# Patient Record
Sex: Male | Born: 1995 | Race: White | Hispanic: No | Marital: Single | Smoking: Current every day smoker
Health system: Southern US, Community
[De-identification: ages and names within clinical notes are randomized; demographics above are authoritative.]

## PROBLEM LIST (undated history)

## (undated) DIAGNOSIS — F909 Attention-deficit hyperactivity disorder, unspecified type: Secondary | ICD-10-CM

## (undated) DIAGNOSIS — R6250 Unspecified lack of expected normal physiological development in childhood: Secondary | ICD-10-CM

## (undated) DIAGNOSIS — E049 Nontoxic goiter, unspecified: Secondary | ICD-10-CM

## (undated) DIAGNOSIS — F633 Trichotillomania: Secondary | ICD-10-CM

## (undated) DIAGNOSIS — IMO0002 Reserved for concepts with insufficient information to code with codable children: Secondary | ICD-10-CM

## (undated) DIAGNOSIS — E063 Autoimmune thyroiditis: Secondary | ICD-10-CM

## (undated) DIAGNOSIS — R4689 Other symptoms and signs involving appearance and behavior: Secondary | ICD-10-CM

## (undated) DIAGNOSIS — R630 Anorexia: Secondary | ICD-10-CM

## (undated) DIAGNOSIS — E079 Disorder of thyroid, unspecified: Secondary | ICD-10-CM

## (undated) HISTORY — DX: Other symptoms and signs involving appearance and behavior: R46.89

## (undated) HISTORY — DX: Autoimmune thyroiditis: E06.3

## (undated) HISTORY — PX: OTHER SURGICAL HISTORY: SHX169

## (undated) HISTORY — DX: Reserved for concepts with insufficient information to code with codable children: IMO0002

## (undated) HISTORY — DX: Anorexia: R63.0

## (undated) HISTORY — DX: Nontoxic goiter, unspecified: E04.9

## (undated) HISTORY — DX: Attention-deficit hyperactivity disorder, unspecified type: F90.9

## (undated) HISTORY — DX: Unspecified lack of expected normal physiological development in childhood: R62.50

## (undated) HISTORY — DX: Trichotillomania: F63.3

---
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
## 2014-01-07 ENCOUNTER — Ambulatory Visit: Payer: Medicaid Other | Admitting: "Endocrinology

## 2014-10-09 ENCOUNTER — Telehealth: Payer: Self-pay | Admitting: "Endocrinology

## 2014-10-09 NOTE — Telephone Encounter (Signed)
1. Mother called to request that I call [facility] to refill his prescriptions. 2. I called the mother. He has not been seen in our clinic since July 2014. According to the laws of [place] we are forbidden to write any prescriptions for patients whom we have not seen within 12 months. I told mom that I will be glad to see Blake Pope in follow up if he is willing to come in for future appointments, but I can't help her with prescriptions until I see him again.  3. Mom stated that she understood. She will make an appointment for him with a local provider on Monday. David StallBRENNAN,MICHAEL J

## 2014-10-10 ENCOUNTER — Emergency Department (INDEPENDENT_AMBULATORY_CARE_PROVIDER_SITE_OTHER)
Admission: EM | Admit: 2014-10-10 | Discharge: 2014-10-10 | Disposition: A | Discharge status: Home / Self Care | Payer: Medicaid Other | Source: Home / Self Care | Attending: Family Medicine | Admitting: Family Medicine

## 2014-10-10 ENCOUNTER — Encounter (HOSPITAL_COMMUNITY): Payer: Self-pay | Admitting: Emergency Medicine

## 2014-10-10 DIAGNOSIS — R590 Localized enlarged lymph nodes: Secondary | ICD-10-CM

## 2014-10-10 LAB — POCT RAPID STREP A: Streptococcus, Group A Screen (Direct): NEGATIVE

## 2014-10-10 LAB — POCT INFECTIOUS MONO SCREEN: MONO SCREEN: NEGATIVE

## 2014-10-10 MED ORDER — AMOXICILLIN-POT CLAVULANATE 500-125 MG PO TABS: 1.0000 | ORAL_TABLET | Freq: Three times a day (TID) | ORAL | Status: DC

## 2014-10-10 NOTE — Discharge Instructions (Signed)
Swollen Lymph Nodes The lymphatic system filters fluid from around cells. It is like a system of blood vessels. These channels carry lymph instead of blood. The lymphatic system is an important part of the immune (disease fighting) system. When people talk about "swollen glands in the neck," they are usually talking about swollen lymph nodes. The lymph nodes are like the little traps for infection. You and your caregiver may be able to feel lymph nodes, especially swollen nodes, in these common areas: the groin (inguinal area), armpits (axilla), and above the clavicle (supraclavicular). You may also feel them in the neck (cervical) and the back of the head just above the hairline (occipital). Swollen glands occur when there is any condition in which the body responds with an allergic type of reaction. For instance, the glands in the neck can become swollen from insect bites or any type of minor infection on the head. These are very noticeable in children with only minor problems. Lymph nodes may also become swollen when there is a tumor or problem with the lymphatic system, such as Hodgkin's disease. TREATMENT   Most swollen glands do not require treatment. They can be observed (watched) for a short period of time, if your caregiver feels it is necessary. Most of the time, observation is not necessary.  Antibiotics (medicines that kill germs) may be prescribed by your caregiver. Your caregiver may prescribe these if he or she feels the swollen glands are due to a bacterial (germ) infection. Antibiotics are not used if the swollen glands are caused by a virus. HOME CARE INSTRUCTIONS   Take medications as directed by your caregiver. Only take over-the-counter or prescription medicines for pain, discomfort, or fever as directed by your caregiver. SEEK MEDICAL CARE IF:   If you begin to run a temperature greater than 102 F (38.9 C), or as your caregiver suggests. MAKE SURE YOU:   Understand these  instructions.  Will watch your condition.  Will get help right away if you are not doing well or get worse. Document Released: 10/06/2002 Document Revised: 01/08/2012 Document Reviewed: 10/16/2005 [facility]Exit[facility] Patient Information 2015 [place], [place]. This information is not intended to replace advice given to you by your health care provider. Make sure you discuss any questions you have with your health care provider.  Cervical Adenitis You have a swollen lymph gland in your neck. This commonly happens with Strep and virus infections, dental problems, insect bites, and injuries about the face, scalp, or neck. The lymph glands swell as the body fights the infection or heals the injury. Swelling and firmness typically lasts for several weeks after the infection or injury is healed. Rarely lymph glands can become swollen because of cancer or TB. Antibiotics are prescribed if there is evidence of an infection. Sometimes an infected lymph gland becomes filled with pus. This condition may require opening up the abscessed gland by draining it surgically. Most of the time infected glands return to normal within two weeks. Do not poke or squeeze the swollen lymph nodes. That may keep them from shrinking back to their normal size. If the lymph gland is still swollen after 2 weeks, further medical evaluation is needed.  SEEK IMMEDIATE MEDICAL CARE IF:  You have difficulty swallowing or breathing, increased swelling, severe pain, or a high fever.  Document Released: 10/16/2005 Document Revised: 01/08/2012 Document Reviewed: 04/07/2007 [facility] Patient Information 2015 [place], [place]. This information is not intended to replace advice given to you by your health care provider. Make sure you  discuss any questions you have with your health care provider. ° °

## 2014-10-10 NOTE — ED Provider Notes (Signed)
CSN: [id number]     Arrival date & time 10/10/14  1315 History   First MD Initiated Contact with Patient 10/10/14 1340     Chief Complaint  Patient presents with  . Mass   (Consider location/radiation/quality/duration/timing/severity/associated sxs/prior Treatment) HPI Comments: 18 year old male concerned about a small tender nodule in the submental area. There is been no discoloration, drainage or bleeding. Has not changed in size.   Past Medical History  Diagnosis Date  . Physical growth delay   . Goiter   . Hypothyroidism, acquired, autoimmune   . Thyroiditis, autoimmune   . Poor appetite   . Trichotillomania   . ADHD (attention deficit hyperactivity disorder)   . Behavioral problems    Past Surgical History  Procedure Laterality Date  . Lacrimal duct surgery     Family History  Problem Relation Age of Onset  . Cancer Mother   . Hypertension Mother   . Diabetes Paternal Grandmother    History  Substance Use Topics  . Smoking status: Passive Smoke Exposure - Never Smoker  . Smokeless tobacco: Never Used  . Alcohol Use: No    Review of Systems  Constitutional: Negative.   HENT: Negative.  Negative for congestion, ear pain, postnasal drip and sore throat.   Respiratory: Negative.   Gastrointestinal: Negative.   Musculoskeletal: Negative for back pain, neck pain and neck stiffness.  Skin: Negative for rash.  All other systems reviewed and are negative.   Allergies  Review of patient's allergies indicates no known allergies.  Home Medications   Prior to Admission medications   Medication Sig Start Date End Date Taking? Authorizing Provider  amoxicillin-clavulanate (AUGMENTIN) 500-125 MG per tablet Take 1 tablet (500 mg total) by mouth 3 (three) times daily. 10/10/14   Hayden Rasmussenavid Rayona Sardinha, NP  cyproheptadine (PERIACTIN) 4 MG tablet TAKE 1 TABLET BY MOUTH AT BREAKFAST AND 1 TABLET AT SUPPER 01/12/12   Shandrika Ambers StallMichael J Brennan, MD  fluvoxaMINE (LUVOX) 50 MG tablet Take 50 mg by  mouth 2 (two) times daily.      Historical Provider, MD  GuanFACINE HCl (INTUNIV) 4 MG TB24 Take by mouth.      Historical Provider, MD  lisdexamfetamine (VYVANSE) 40 MG capsule Take 40 mg by mouth every morning.      Historical Provider, MD  mirtazapine (REMERON) 7.5 MG tablet Take 7.5 mg by mouth at bedtime.     Historical Provider, MD  SYNTHROID 50 MCG tablet Take 1 tablet (50 mcg total) by mouth daily before breakfast. Take one tablet on odd days and 1.5 tablets on even days. 05/13/13 05/13/14  Lucresia Simic StallMichael J Brennan, MD   BP 123/76 mmHg  Pulse 91  Temp(Src) 97.9 F (36.6 C) (Oral)  Resp 16  SpO2 100% Physical Exam  Constitutional: He is oriented to person, place, and time. He appears well-developed and well-nourished. No distress.  Does not appear ill.  HENT:  Mouth/Throat: No oropharyngeal exudate.  Bilateral TMs are normal Oropharynx with moderate erythema. No exudates.  Eyes: Conjunctivae and EOM are normal.  Neck: Normal range of motion. Neck supple.  Pulmonary/Chest: Effort normal. No respiratory distress.  Lymphadenopathy:       Head (right side): Submental and submandibular adenopathy present. No preauricular, no posterior auricular and no occipital adenopathy present.       Head (left side): Submandibular adenopathy present.    He has cervical adenopathy.       Right cervical: Superficial cervical adenopathy present. No posterior cervical adenopathy present.  Left cervical: Superficial cervical adenopathy present. No posterior cervical adenopathy present.  Neurological: He is alert and oriented to person, place, and time.  Skin: Skin is warm and dry. No rash noted.  Psychiatric: He has a normal mood and affect.  Nursing note and vitals reviewed.   ED Course  Procedures (including critical care time) Labs Review Labs Reviewed  POCT RAPID STREP A ([facility] ONLY)   Results for orders placed or performed during the hospital encounter of 10/10/14  POCT rapid strep A  [facility])  Result Value Ref Range   Streptococcus, Group A Screen (Direct) NEGATIVE NEGATIVE    Imaging Review No results found.   MDM   1. Lymphadenopathy, cervical    Augmentin x 7 d Call your doctor Monday for appt May need additional blood work as well as thyroid testing and meds Mono spot pending       Hayden RasmussenDavid Tishana Clinkenbeard, NP 10/10/14 1448

## 2014-10-10 NOTE — ED Notes (Signed)
C/o mass under chin/throat onset 1 week Painful, 5/10,  when pressure is applied; constant mild pain 1/10 Denies inj/trauma, fevers, chills Also reports he has been out of his Thyroid meds x8 months ++ Alert, no signs of acute distress.

## 2014-10-12 LAB — CULTURE, GROUP A STREP

## 2014-11-10 ENCOUNTER — Encounter: Payer: Self-pay | Admitting: "Endocrinology

## 2014-11-10 ENCOUNTER — Ambulatory Visit (INDEPENDENT_AMBULATORY_CARE_PROVIDER_SITE_OTHER): Payer: Medicaid Other | Admitting: "Endocrinology

## 2014-11-10 VITALS — BP 132/80 | HR 112 | Wt 120.2 lb

## 2014-11-10 DIAGNOSIS — E063 Autoimmune thyroiditis: Secondary | ICD-10-CM

## 2014-11-10 DIAGNOSIS — G44229 Chronic tension-type headache, not intractable: Secondary | ICD-10-CM

## 2014-11-10 DIAGNOSIS — E049 Nontoxic goiter, unspecified: Secondary | ICD-10-CM

## 2014-11-10 DIAGNOSIS — E038 Other specified hypothyroidism: Secondary | ICD-10-CM

## 2014-11-10 MED ORDER — SYNTHROID 75 MCG PO TABS: 75.0000 ug | ORAL_TABLET | Freq: Every day | ORAL | Status: DC

## 2014-11-10 NOTE — Patient Instructions (Signed)
Follow up visit in 3 months. Please have your thyroid blood tests drawn one week prior to your next visit.

## 2014-11-10 NOTE — Progress Notes (Addendum)
Subjective:  Patient Name: Blake Pope Date of Birth: Dec 02, 1995  MRN: [id number]  Blake Pope  presents to the office today for follow-up of his hypothyroidism, thyroiditis, growth delay, goiter, and poor appetite.  HISTORY OF PRESENT ILLNESS:   Blake Pope is a 19 y.o. Caucasian young man. Blake Pope was accompanied by his girlfriend, Blake Pope.  1. I first saw the patient in consultation on 09/06/06. He been referred by Dr. Yehuda Budd for evaluation and management of growth delay. He was then 52 years old.  A. The child was the product of an uneventful pregnancy, except for the fact that the mother smoked. The baby was born at term. Birth weight was 6 lbs. 7 oz. He had a closed lacrimal duct that was opened surgically at 64 months of age. Around the age of 2 or 3 he was diagnosed with ADHD. Around the age of 3 or 4 he started on stimulant medications. Mother had been concerned for some years about the fact that the child was not growing well. She was also concerned about the fact that he was not eating well at home. He was quite active. Family history was positive for a variety of heights. The father's height was 73 inches. The mother's height was 60-1/4 inches. A maternal grand-cousin was only 4 foot tall.  B. On physical examination, Blake Pope was a very short, slender little boy. His height was slightly less than the 3rd percentile. His weight was significantly less than 3rd percentile. He was the size of an average 80-year-old. His physical examination was otherwise unremarkable. His IGF-1 was 134 which was normal for the patient's age. IGFBP-3 was 4.2, which was mid-range normal. TFTs were abnormal, with TSH elevated at 7.839, free T4 low-normal at 0.96, and free T3 relatively low for his age at 3.0. At that point it appeared that the patient likely had growth delay due to several factors that were independently affecting his growth. The patient had a pattern of relative protein-calorie malnutrition. The  stimulant medications definitely suppressed his appetite.  He was a very physically active young man, consistent with his diagnosis of ADD. There was a wide variety of family heights including some that were very short. And finally, the patient was clearly hypothyroid at that time. The question was whether his hypothyroidism was permanent or transient.  C. I repeated the TFTs the following month. His TSH was 6.222, free T4 was 1.20, and free T3 was 3.4. At that point I started him on Synthroid, 25 mcg per day. Within 2 months the TSH was down to 1.998, free T4 was 1.41, and free T3 was 3.7. During the next 6 years we have gradually increased his dose of Synthroid to 50 mcg per day alternating with 75 mcg/day. We also started him on cyproheptadine 4 mg twice daily as an appetite stimulant. After the cyproheptadine was initiated in September 2010, his weight began to increase significantly and his height followed, but to a lesser extent. He continued to grow in height on the 3rd percentile curve. His weight had increased to the 15-18% range.  2. The patient's last [facility] visit was on 05/28/13. In the interim, he has been healthy except for an occasional URI. He has been having headaches about 1-2 times a per month. Headaches typically occur in the posterior part of the scalp. His trichotillomania has resolved for the most part. He is stressed out now about trying to find a job. The patient is back on probation. He did not want to talk  about what misdemeanor put him back in probation. He has about 18 months of probation remaining. He has not had any Synthroid or other medications for several months. He now has a good appetite. He is doing pretty well emotionally, despite losing his dad unexpectedly to an esophageal hemorrhage  in October.   3. Pertinent Review of Systems:  Constitutional: The patient feels "good, but lazy every once in awhile".  Eyes: Vision seems to be good. There are no recognized eye  problems. Neck: The patient has no complaints of anterior neck swelling, soreness, tenderness, pressure, discomfort, or difficulty swallowing.   Heart: Heart rate increases with exercise or other physical activity. The patient has no complaints of palpitations, irregular heart beats, chest pain, or chest pressure.   Gastrointestinal: Bowel movents seem normal. The patient has no complaints of excessive hunger, acid reflux, upset stomach, stomach aches or pains, diarrhea, or constipation.  Legs: He has occasional knee pains if he does a lot of physical activity. Muscle mass and strength seem normal. There are no other complaints of numbness, tingling, burning, or pain. No edema is noted.  Feet: There are no obvious foot problems. There are no complaints of numbness, tingling, burning, or pain. No edema is noted. Neurologic: There are no recognized problems with muscle movement and strength, sensation, or coordination.  PAST MEDICAL AND FAMILY HISTORY  Past Medical History  Diagnosis Date  . Physical growth delay   . Goiter   . Hypothyroidism, acquired, autoimmune   . Thyroiditis, autoimmune   . Poor appetite   . Trichotillomania   . ADHD (attention deficit hyperactivity disorder)   . Behavioral problems     Family History  Problem Relation Age of Onset  . Cancer Mother   . Hypertension Mother   . Diabetes Paternal Grandmother     Current outpatient prescriptions: amoxicillin-clavulanate (AUGMENTIN) 500-125 MG per tablet, Take 1 tablet (500 mg total) by mouth 3 (three) times daily. (Patient not taking: Reported on 11/10/2014), Disp: 21 tablet, Rfl: 0;  cyproheptadine (PERIACTIN) 4 MG tablet, TAKE 1 TABLET BY MOUTH AT BREAKFAST AND 1 TABLET AT SUPPER (Patient not taking: Reported on 11/10/2014), Disp: 60 tablet, Rfl: 3 fluvoxaMINE (LUVOX) 50 MG tablet, Take 50 mg by mouth 2 (two) times daily.  , Disp: , Rfl: ;  GuanFACINE HCl (INTUNIV) 4 MG TB24, Take by mouth.  , Disp: , Rfl: ;   lisdexamfetamine (VYVANSE) 40 MG capsule, Take 40 mg by mouth every morning.  , Disp: , Rfl: ;  mirtazapine (REMERON) 7.5 MG tablet, Take 7.5 mg by mouth at bedtime. , Disp: , Rfl:  SYNTHROID 50 MCG tablet, Take 1 tablet (50 mcg total) by mouth daily before breakfast. Take one tablet on odd days and 1.5 tablets on even days., Disp: 45 tablet, Rfl: 6;  [DISCONTINUED] cetirizine (ZYRTEC) 10 MG tablet, Take 1 tablet (10 mg total) by mouth daily., Disp: 30 tablet, Rfl: 0  Allergies as of 11/10/2014  . (No Known Allergies)    SOCIAL HISTORY  1. School: He is still living with his mom until he finds a steady job.  2. Activities: He has been fairly active. 3. Smoking, alcohol, or drugs: He is smoking now. He is not drinking alcohol. He is not taking any illicit drugs.  4. Primary Care Provider: None now. He has seen Dr. Billee CashingWayland McKenzie in the past.  REVIEW OF SYSTEMS: There are no other significant problems involving Blake Pope's other body systems.   Objective:  Vital Signs:  BP  132/80 mmHg  Pulse 112  Wt 120 lb 3.2 oz (54.522 kg)   Ht Readings from Last 3 Encounters:  05/13/13 5' 3.86" (1.622 m) (3 %*, Z = -1.94)  10/28/12 5' 3.5" (1.613 m) (2 %*, Z = -2.00)  08/29/11 5' 3.23" (1.606 m) (3 %*, Z = -1.84)   * Growth percentiles are based on CDC 2-20 Years data.   There is no height on file to calculate BSA.  PHYSICAL EXAM:  Constitutional: The patient appears healthy, slender, but muscular. The patient's height has plateaued. His weight has increased 9 pounds since his lest visit. He is a bright, alert, and much more mature today.   Head: The head is normocephalic. Face: The face appears normal. There are no obvious dysmorphic features. Eyes: There is no obvious arcus or proptosis. Moisture appears normal. Mouth: The oropharynx and tongue appear normal. Dentition appears to be normal for age. Oral moisture is normal. Neck: The neck appears to be visibly normal. No carotid bruits are  noted. The thyroid gland is a bit larger at about 21-22 grams in size. The consistency of the thyroid gland is relatively firm. The thyroid gland is not tender to palpation. His trapezius muscles are somewhat stiff and mildly tender today. Lungs: The lungs are clear to auscultation. Air movement is good. Heart: Heart rate and rhythm are regular. Heart sounds S1 and S2 are normal. I did not appreciate any pathologic cardiac murmurs. His physique is slender, but not emaciated.  Abdomen: The abdomen is normal in size for the patient's age. Bowel sounds are normal. There is no obvious hepatomegaly, splenomegaly, or other mass effect.  Arms: Muscle size and bulk are normal for age. Hands: There is no obvious tremor. Phalangeal and metacarpophalangeal joints are normal. Palmar muscles are normal for age. Palmar skin is normal. Palmar moisture is also normal. Legs: Muscles appear normal for age. No edema is present. Neurologic: Strength is normal for age in both the upper and lower extremities. Muscle tone is normal. Sensation to touch is normal in both legs.    LAB DATA:   05/12/13: TSH 5.492, free T4 1.43, free T3 4.3  10/25/12: TSH 2.95, free T4 1.32, free T3 3.5  06/30/11: TSH 2.898, free T4 1.35, free T3 3.7.  Both lab results were on Synthroid 50 mcg per day.  No results found for this or any previous visit (from the past 504 hour(s)).   Assessment and Plan:   ASSESSMENT:  1. Hypothyroid: Patient has been without Synthroid for several months. He needs to re-start Synthroid at a slightly higher dose of 75 mcg/day and then repeat his TFTs in 2 months.   2.  Hashimoto's disease: Inflammation is clinically quiescent.  3.  Goiter: The thyroid is larger today, partly due to thyroiditis and partly due to increased TSH.  4. Headaches: The character of his headaches and the neurologic distribution of his headaches is most c/w tension headaches. The presumed hypothyroidism is likely causing some  additional muscle stiffness and pain.    PLAN:  1. Diagnostic:  TFTs in three months  2. Therapeutic: Increase Synthroid to 75 mcg daily.  3. Patient Education: We discussed all of the adverse effects of chronic hypothyroidism.  4. Follow-up: 3 months  Level of Service: This visit lasted in excess of 40 minutes. More than 50% of the visit was devoted to counseling.  David Stall, MD 11/10/2014 1:50 PM

## 2015-01-28 ENCOUNTER — Other Ambulatory Visit: Payer: Self-pay | Admitting: *Deleted

## 2015-01-28 DIAGNOSIS — E034 Atrophy of thyroid (acquired): Secondary | ICD-10-CM

## 2015-02-15 ENCOUNTER — Ambulatory Visit: Payer: Medicaid Other | Admitting: "Endocrinology

## 2015-06-18 DIAGNOSIS — Z8659 Personal history of other mental and behavioral disorders: Secondary | ICD-10-CM | POA: Diagnosis not present

## 2015-06-18 DIAGNOSIS — J340 Abscess, furuncle and carbuncle of nose: Secondary | ICD-10-CM | POA: Diagnosis present

## 2015-06-18 DIAGNOSIS — L03211 Cellulitis of face: Secondary | ICD-10-CM | POA: Insufficient documentation

## 2015-06-18 DIAGNOSIS — E049 Nontoxic goiter, unspecified: Secondary | ICD-10-CM | POA: Diagnosis not present

## 2015-06-18 DIAGNOSIS — E063 Autoimmune thyroiditis: Secondary | ICD-10-CM | POA: Diagnosis not present

## 2015-06-19 ENCOUNTER — Emergency Department (HOSPITAL_COMMUNITY)
Admission: EM | Admit: 2015-06-19 | Discharge: 2015-06-19 | Disposition: A | Discharge status: Home / Self Care | Payer: Medicaid Other | Attending: Emergency Medicine | Admitting: Emergency Medicine

## 2015-06-19 ENCOUNTER — Encounter (HOSPITAL_COMMUNITY): Payer: Self-pay | Admitting: Emergency Medicine

## 2015-06-19 ENCOUNTER — Emergency Department (HOSPITAL_COMMUNITY): Payer: Medicaid Other

## 2015-06-19 DIAGNOSIS — L03211 Cellulitis of face: Secondary | ICD-10-CM

## 2015-06-19 LAB — CBC WITH DIFFERENTIAL/PLATELET
BASOS ABS: 0.1 10*3/uL (ref 0.0–0.1)
Basophils Relative: 1 % (ref 0–1)
Eosinophils Absolute: 0.3 10*3/uL (ref 0.0–0.7)
Eosinophils Relative: 3 % (ref 0–5)
HEMATOCRIT: 41.2 % (ref 39.0–52.0)
Hemoglobin: 14.4 g/dL (ref 13.0–17.0)
LYMPHS PCT: 30 % (ref 12–46)
Lymphs Abs: 2.8 10*3/uL (ref 0.7–4.0)
MCH: 29.8 pg (ref 26.0–34.0)
MCHC: 35 g/dL (ref 30.0–36.0)
MCV: 85.3 fL (ref 78.0–100.0)
Monocytes Absolute: 0.8 10*3/uL (ref 0.1–1.0)
Monocytes Relative: 8 % (ref 3–12)
NEUTROS ABS: 5.3 10*3/uL (ref 1.7–7.7)
Neutrophils Relative %: 58 % (ref 43–77)
Platelets: 215 10*3/uL (ref 150–400)
RBC: 4.83 MIL/uL (ref 4.22–5.81)
RDW: 12.8 % (ref 11.5–15.5)
WBC: 9.2 10*3/uL (ref 4.0–10.5)

## 2015-06-19 LAB — I-STAT CHEM 8, ED
BUN: 6 mg/dL (ref 6–20)
CREATININE: 0.8 mg/dL (ref 0.61–1.24)
Calcium, Ion: 1.18 mmol/L (ref 1.12–1.23)
Chloride: 100 mmol/L — ABNORMAL LOW (ref 101–111)
GLUCOSE: 69 mg/dL (ref 65–99)
HCT: 44 % (ref 39.0–52.0)
Hemoglobin: 15 g/dL (ref 13.0–17.0)
POTASSIUM: 3.7 mmol/L (ref 3.5–5.1)
Sodium: 140 mmol/L (ref 135–145)
TCO2: 27 mmol/L (ref 0–100)

## 2015-06-19 MED ORDER — SODIUM CHLORIDE 0.9 % IV SOLN
1.5000 g | Freq: Once | INTRAVENOUS | Status: AC
  Administered 2015-06-19: 1.5 g via INTRAVENOUS
  Filled 2015-06-19: qty 1.5

## 2015-06-19 MED ORDER — KETOROLAC TROMETHAMINE 30 MG/ML IJ SOLN: 30.0000 mg | Freq: Once | INTRAMUSCULAR | Status: DC

## 2015-06-19 MED ORDER — CLINDAMYCIN HCL 150 MG PO CAPS: 300.0000 mg | ORAL_CAPSULE | Freq: Four times a day (QID) | ORAL | Status: DC

## 2015-06-19 MED ORDER — MORPHINE SULFATE (PF) 4 MG/ML IV SOLN: 4.0000 mg | Freq: Once | INTRAVENOUS | Status: DC

## 2015-06-19 MED ORDER — IOHEXOL 300 MG/ML  SOLN
75.0000 mL | Freq: Once | INTRAMUSCULAR | Status: AC | PRN
  Administered 2015-06-19: 75 mL via INTRAVENOUS

## 2015-06-19 MED ORDER — TRAMADOL HCL 50 MG PO TABS: 50.0000 mg | ORAL_TABLET | Freq: Four times a day (QID) | ORAL | Status: DC | PRN

## 2015-06-19 NOTE — Discharge Instructions (Signed)

## 2015-06-19 NOTE — ED Provider Notes (Signed)
CSN: [id number]     Arrival date & time 06/18/15  2357 History  This chart was scribed for Blake Severin, MD by Tanda Rockers, ED Scribe. This patient was seen in room A08C/A08C and the patient's care was started at 1:13 AM.  Chief Complaint  Patient presents with  . Abscess   The history is provided by the patient. No language interpreter was used.     HPI Comments: Blake Pope is a 19 y.o. male who presents to the Emergency Department complaining of abscess to right side of nose that began 1 day ago. Pt states it started out as a pimple. He attempted to pop it at approximately 2-3 PM yesterday (approximately 11 hours ago). He states that he noticed swelling to the area as well as underneath his right eye around 6 PM today (7 hours ago), causing concern. Pt states he attempted to pop it again and states pus came out. Pt states he is having difficulty seeing out of his right eye due to the swelling. He also has mild pain with blinking his right eye. He denies fever, chills, blurred vision, or any other associated symptoms.   Past Medical History  Diagnosis Date  . Physical growth delay   . Goiter   . Hypothyroidism, acquired, autoimmune   . Thyroiditis, autoimmune   . Poor appetite   . Trichotillomania   . ADHD (attention deficit hyperactivity disorder)   . Behavioral problems    Past Surgical History  Procedure Laterality Date  . Lacrimal duct surgery     Family History  Problem Relation Age of Onset  . Cancer Mother   . Hypertension Mother   . Diabetes Paternal Grandmother    Social History  Substance Use Topics  . Smoking status: Passive Smoke Exposure - Never Smoker  . Smokeless tobacco: Never Used  . Alcohol Use: No    Review of Systems  Constitutional: Negative for fever and chills.  Eyes: Positive for pain. Negative for visual disturbance.  Skin:       Abscess to right side of nose  All other systems reviewed and are negative.  Allergies  Review of patient's  allergies indicates no known allergies.  Home Medications   Prior to Admission medications   Medication Sig Start Date End Date Taking? Authorizing Provider  ibuprofen (ADVIL,MOTRIN) 200 MG tablet Take 400 mg by mouth every 6 (six) hours as needed for moderate pain.   Yes Historical Provider, MD  SYNTHROID 75 MCG tablet Take 1 tablet (75 mcg total) by mouth daily before breakfast. Take one 75 mcg tablet of brand Synthroid daily. 11/10/14 11/11/15 Yes David Stall, MD   Triage Vitals: BP 121/70 mmHg  Pulse 94  Temp(Src) 98.1 F (36.7 C) (Oral)  Resp 18  Ht 5\' 5"  (1.651 m)  Wt 120 lb (54.432 kg)  BMI 19.97 kg/m2  SpO2 99%   Physical Exam  Constitutional: He is oriented to person, place, and time. He appears well-developed and well-nourished.  HENT:  Head: Normocephalic and atraumatic.  Nose: Nose normal.  Mouth/Throat: Oropharynx is clear and moist.  Patient has soft tissue swelling starting below the right eye and extending to the right upper lip.  There is a pustule just lateral to the nose which patient reports he has popped twice today expressing pus.  There is no fluctuance with palpation at this time.  No purulence.  There is erythema  Eyes: Conjunctivae and EOM are normal. Pupils are equal, round, and reactive to light.  Neck: Normal range of motion. Neck supple. No JVD present. No tracheal deviation present. No thyromegaly present.  Cardiovascular: Normal rate, regular rhythm, normal heart sounds and intact distal pulses.  Exam reveals no gallop and no friction rub.   No murmur heard. Pulmonary/Chest: Effort normal and breath sounds normal. No stridor. No respiratory distress. He has no wheezes. He has no rales. He exhibits no tenderness.  Abdominal: Soft. Bowel sounds are normal. He exhibits no distension and no mass. There is no tenderness. There is no rebound and no guarding.  Musculoskeletal: Normal range of motion. He exhibits no edema or tenderness.  Lymphadenopathy:     He has no cervical adenopathy.  Neurological: He is alert and oriented to person, place, and time. He displays normal reflexes. He exhibits normal muscle tone. Coordination normal.  Skin: Skin is warm and dry. No rash noted. No erythema. No pallor.  Psychiatric: He has a normal mood and affect. His behavior is normal. Judgment and thought content normal.  Nursing note and vitals reviewed.   ED Course  Procedures (including critical care time)  DIAGNOSTIC STUDIES: Oxygen Saturation is 99% on RA, normal by my interpretation.    COORDINATION OF CARE: 1:17 AM-Discussed treatment plan which includes IV antibiotics, CT maxillofacial with pt at bedside and pt agreed to plan.   Labs Review Labs Reviewed  I-STAT CHEM 8, ED - Abnormal; Notable for the following:    Chloride 100 (*)    All other components within normal limits  CBC WITH DIFFERENTIAL/PLATELET    Imaging Review Ct Maxillofacial W/cm  06/19/2015   CLINICAL DATA:  Swelling, pain and pus on RIGHT side of nose, ascending toward the eye. History of hypothyroidism.  EXAM: CT MAXILLOFACIAL WITH CONTRAST  TECHNIQUE: Multidetector CT imaging of the maxillofacial structures was performed with intravenous contrast. Multiplanar CT image reconstructions were also generated. A small metallic BB was placed on the right temple in order to reliably differentiate right from left.  CONTRAST:  75mL OMNIPAQUE IOHEXOL 300 MG/ML  SOLN  COMPARISON:  CT head November 15, 2005  FINDINGS: RIGHT facial/ nasal labial and nasal soft tissue swelling extending to the medial and infraorbital soft tissues. Hyperemia, without abscess, free fluid, subcutaneous gas or radiopaque foreign bodies.  Ocular globes and orbital contents are unremarkable.  No destructive bony lesions. No facial fracture. Paranasal sinuses and mastoid air cells are well aerated.  IMPRESSION: RIGHT facial/periorbital cellulitis without abscess or postseptal extent.   Electronically Signed   By:  Awilda Metro M.D.   On: 06/19/2015 03:57   I have personally reviewed and evaluated these images and lab results as part of my medical decision-making.   EKG Interpretation None      MDM   Final diagnoses:  Facial cellulitis    I personally performed the services described in this documentation, which was scribed in my presence. The recorded information has been reviewed and is accurate.  19 year old male with pimple now with swelling, pain and redness to the right side of his face.  Concern for preseptal cellulitis.  Patient has no pain with movement of the eye, so doubt orbital cellulitis at this time.  Plan for IV antibiotics and CT max face to evaluate for any further fluid collection.  Pt with cellulitis without orbital involvement, no signs of abscess.  Will d/c home with clinda to cover for MRSA.     Blake Severin, MD 06/19/15 [phone]

## 2015-06-19 NOTE — ED Notes (Signed)
Patient with swelling and pus on right side of nose.  Swelling is moving up towards eye.  Patient states that the pain is excruciating.

## 2015-06-19 NOTE — ED Notes (Signed)
Patient left at this time with all belongings. 

## 2015-09-24 ENCOUNTER — Emergency Department (HOSPITAL_COMMUNITY)
Admission: EM | Admit: 2015-09-24 | Discharge: 2015-09-24 | Disposition: A | Discharge status: Home / Self Care | Payer: Medicaid Other | Attending: Emergency Medicine | Admitting: Emergency Medicine

## 2015-09-24 ENCOUNTER — Encounter (HOSPITAL_COMMUNITY): Payer: Self-pay

## 2015-09-24 DIAGNOSIS — Z79899 Other long term (current) drug therapy: Secondary | ICD-10-CM | POA: Diagnosis not present

## 2015-09-24 DIAGNOSIS — Z792 Long term (current) use of antibiotics: Secondary | ICD-10-CM | POA: Diagnosis not present

## 2015-09-24 DIAGNOSIS — L309 Dermatitis, unspecified: Secondary | ICD-10-CM | POA: Insufficient documentation

## 2015-09-24 DIAGNOSIS — Z8659 Personal history of other mental and behavioral disorders: Secondary | ICD-10-CM | POA: Diagnosis not present

## 2015-09-24 DIAGNOSIS — E039 Hypothyroidism, unspecified: Secondary | ICD-10-CM | POA: Insufficient documentation

## 2015-09-24 DIAGNOSIS — R21 Rash and other nonspecific skin eruption: Secondary | ICD-10-CM | POA: Diagnosis present

## 2015-09-24 MED ORDER — HYDROCORTISONE 2.5 % EX LOTN: TOPICAL_LOTION | Freq: Two times a day (BID) | CUTANEOUS | Status: DC

## 2015-09-24 NOTE — ED Provider Notes (Signed)
CSN: [id number]     Arrival date & time 09/24/15  0146 History   First MD Initiated Contact with Patient 09/24/15 0204     Chief Complaint  Patient presents with  . Rash     (Consider location/radiation/quality/duration/timing/severity/associated sxs/prior Treatment) Patient is a 19 y.o. male presenting with rash. The history is provided by the patient. No language interpreter was used.  Rash Location:  Shoulder/arm and torso Shoulder/arm rash location:  R forearm and L arm Torso rash location:  Upper back Quality: dryness and redness (mild)   Quality: not burning, not draining, not painful, not swelling and not weeping   Severity:  Mild Onset quality:  Gradual Duration:  1 month Timing:  Constant Progression:  Unchanged Chronicity:  New Context: not exposure to similar rash, not medications, not new detergent/soap and not sick contacts   Relieved by:  Nothing Ineffective treatments:  None tried Associated symptoms: no fever, no periorbital edema, no shortness of breath, no throat swelling, no tongue swelling and not wheezing     Past Medical History  Diagnosis Date  . Physical growth delay   . Goiter   . Hypothyroidism, acquired, autoimmune   . Thyroiditis, autoimmune   . Poor appetite   . Trichotillomania   . ADHD (attention deficit hyperactivity disorder)   . Behavioral problems    Past Surgical History  Procedure Laterality Date  . Lacrimal duct surgery     Family History  Problem Relation Age of Onset  . Cancer Mother   . Hypertension Mother   . Diabetes Paternal Grandmother    Social History  Substance Use Topics  . Smoking status: Passive Smoke Exposure - Never Smoker  . Smokeless tobacco: Never Used  . Alcohol Use: No    Review of Systems  Constitutional: Negative for fever.  HENT: Negative for trouble swallowing.   Respiratory: Negative for shortness of breath and wheezing.   Skin: Positive for rash.  All other systems reviewed and are  negative.   Allergies  Review of patient's allergies indicates no known allergies.  Home Medications   Prior to Admission medications   Medication Sig Start Date End Date Taking? Authorizing Provider  clindamycin (CLEOCIN) 150 MG capsule Take 2 capsules (300 mg total) by mouth every 6 (six) hours. 06/19/15   [name], MD  hydrocortisone 2.5 % lotion Apply topically 2 (two) times daily. Apply to rash. Do not apply to face. 09/24/15   Antony Madura, PA-C  ibuprofen (ADVIL,MOTRIN) 200 MG tablet Take 400 mg by mouth every 6 (six) hours as needed for moderate pain.    Historical Provider, MD  SYNTHROID 75 MCG tablet Take 1 tablet (75 mcg total) by mouth daily before breakfast. Take one 75 mcg tablet of brand Synthroid daily. 11/10/14 11/11/15  David Stall, MD  traMADol (ULTRAM) 50 MG tablet Take 1 tablet (50 mg total) by mouth every 6 (six) hours as needed for moderate pain. 06/19/15   [name], MD   BP 114/74 mmHg  Pulse 92  Temp(Src) 98.1 F (36.7 C) (Oral)  Resp 16  SpO2 100%   Physical Exam  Constitutional: He is oriented to person, place, and time. He appears well-developed and well-nourished. No distress.  HENT:  Head: Normocephalic and atraumatic.  No angioedema. Patient tolerating secretions without difficulty.  Eyes: Conjunctivae and EOM are normal. No scleral icterus.  Neck: Normal range of motion.  No stridor  Pulmonary/Chest: Effort normal. No respiratory distress.  Respirations even and unlabored  Musculoskeletal: Normal  range of motion.  Neurological: He is alert and oriented to person, place, and time. He exhibits normal muscle tone. Coordination normal.  Skin: Skin is warm and dry. Rash noted. He is not diaphoretic. No erythema. No pallor.  Slightly raised papular rash which is dry in appearance and mildly erythematous. Areas are less than 1 cm in diameter. Noted to left shoulder blade as well as anterior axillary line on the left side and the right forearm.   Psychiatric: He has a normal mood and affect. His behavior is normal.  Nursing note and vitals reviewed.   ED Course  Procedures (including critical care time) Labs Review Labs Reviewed - No data to display  Imaging Review No results found. I have personally reviewed and evaluated these images and lab results as part of my medical decision-making.   EKG Interpretation None      MDM   Final diagnoses:  Eczema    19 year old male presents to the emergency department for evaluation of rash which has been present for the past month. Rash has been unchanged. Physical exam findings consistent with eczema. Patient will be discharged with prescription for hydrocortisone lotion to apply topically to the area. Have advised frequent moisturizing as well as lowering the temperature of his bath/shower water. Primary care follow-up advised and return precautions given. Patient discharged in good condition with no unaddressed concerns.   Filed Vitals:   09/24/15 0153  BP: 114/74  Pulse: 92  Temp: 98.1 F (36.7 C)  TempSrc: Oral  Resp: 16  SpO2: 100%     Antony MaduraKelly Iviona Hole, PA-C 09/24/15 0216  Dione Boozeavid Glick, MD 09/24/15 [phone]

## 2015-09-24 NOTE — ED Notes (Signed)
Pt here with c/o of rash to left upper arm near axilla, noticed a month ago. Area is one small dry red patch and states he has noticed these spots on various places like left shoulder blade, right upper arm and smaller patch to left chest. He thinks he might have eczema.

## 2015-09-24 NOTE — Discharge Instructions (Signed)
Eczema Eczema, also called atopic dermatitis, is a skin disorder that causes inflammation of the skin. It causes a red rash and dry, scaly skin. The skin becomes very itchy. Eczema is generally worse during the cooler winter months and often improves with the warmth of summer. Eczema usually starts showing signs in infancy. Some children outgrow eczema, but it may last through adulthood.  CAUSES  The exact cause of eczema is not known, but it appears to run in families. People with eczema often have a family history of eczema, allergies, asthma, or hay fever. Eczema is not contagious. Flare-ups of the condition may be caused by:   Contact with something you are sensitive or allergic to.   Stress. SIGNS AND SYMPTOMS  Dry, scaly skin.   Red, itchy rash.   Itchiness. This may occur before the skin rash and may be very intense.  DIAGNOSIS  The diagnosis of eczema is usually made based on symptoms and medical history. TREATMENT  Eczema cannot be cured, but symptoms usually can be controlled with treatment and other strategies. A treatment plan might include:  Controlling the itching and scratching.   Use over-the-counter antihistamines as directed for itching. This is especially useful at night when the itching tends to be worse.   Use over-the-counter steroid creams as directed for itching.   Avoid scratching. Scratching makes the rash and itching worse. It may also result in a skin infection (impetigo) due to a break in the skin caused by scratching.   Keeping the skin well moisturized with creams every day. This will seal in moisture and help prevent dryness. Lotions that contain alcohol and water should be avoided because they can dry the skin.   Limiting exposure to things that you are sensitive or allergic to (allergens).   Recognizing situations that cause stress.   Developing a plan to manage stress.  HOME CARE INSTRUCTIONS   Only take over-the-counter or  prescription medicines as directed by your health care provider.   Do not use anything on the skin without checking with your health care provider.   Keep baths or showers short (5 minutes) in warm (not hot) water. Use mild cleansers for bathing. These should be unscented. You may add nonperfumed bath oil to the bath water. It is best to avoid soap and bubble bath.   Immediately after a bath or shower, when the skin is still damp, apply a moisturizing ointment to the entire body. This ointment should be a petroleum ointment. This will seal in moisture and help prevent dryness. The thicker the ointment, the better. These should be unscented.   Keep fingernails cut short. Children with eczema may need to wear soft gloves or mittens at night after applying an ointment.   Dress in clothes made of cotton or cotton blends. Dress lightly, because heat increases itching.   A child with eczema should stay away from anyone with fever blisters or cold sores. The virus that causes fever blisters (herpes simplex) can cause a serious skin infection in children with eczema. SEEK MEDICAL CARE IF:   Your itching interferes with sleep.   Your rash gets worse or is not better within 1 week after starting treatment.   You see pus or soft yellow scabs in the rash area.   You have a fever.   You have a rash flare-up after contact with someone who has fever blisters.    This information is not intended to replace advice given to you by your health care   provider. Make sure you discuss any questions you have with your health care provider.   Document Released: 10/13/2000 Document Revised: 08/06/2013 Document Reviewed: 05/19/2013 Elsevier Interactive Patient Education 2016 Elsevier Inc.  

## 2015-09-27 ENCOUNTER — Emergency Department (HOSPITAL_COMMUNITY)
Admission: EM | Admit: 2015-09-27 | Discharge: 2015-09-27 | Disposition: A | Discharge status: Home / Self Care | Payer: Medicaid Other | Attending: Emergency Medicine | Admitting: Emergency Medicine

## 2015-09-27 ENCOUNTER — Encounter (HOSPITAL_COMMUNITY): Payer: Self-pay | Admitting: *Deleted

## 2015-09-27 DIAGNOSIS — S40862A Insect bite (nonvenomous) of left upper arm, initial encounter: Secondary | ICD-10-CM | POA: Diagnosis not present

## 2015-09-27 DIAGNOSIS — W57XXXA Bitten or stung by nonvenomous insect and other nonvenomous arthropods, initial encounter: Secondary | ICD-10-CM | POA: Insufficient documentation

## 2015-09-27 DIAGNOSIS — Z7952 Long term (current) use of systemic steroids: Secondary | ICD-10-CM | POA: Insufficient documentation

## 2015-09-27 DIAGNOSIS — E039 Hypothyroidism, unspecified: Secondary | ICD-10-CM | POA: Insufficient documentation

## 2015-09-27 DIAGNOSIS — Y998 Other external cause status: Secondary | ICD-10-CM | POA: Diagnosis not present

## 2015-09-27 DIAGNOSIS — L089 Local infection of the skin and subcutaneous tissue, unspecified: Secondary | ICD-10-CM | POA: Insufficient documentation

## 2015-09-27 DIAGNOSIS — L03124 Acute lymphangitis of left upper limb: Secondary | ICD-10-CM | POA: Diagnosis not present

## 2015-09-27 DIAGNOSIS — Z79899 Other long term (current) drug therapy: Secondary | ICD-10-CM | POA: Diagnosis not present

## 2015-09-27 DIAGNOSIS — Y9389 Activity, other specified: Secondary | ICD-10-CM | POA: Insufficient documentation

## 2015-09-27 DIAGNOSIS — Y9289 Other specified places as the place of occurrence of the external cause: Secondary | ICD-10-CM | POA: Diagnosis not present

## 2015-09-27 DIAGNOSIS — Z23 Encounter for immunization: Secondary | ICD-10-CM | POA: Insufficient documentation

## 2015-09-27 DIAGNOSIS — Z8659 Personal history of other mental and behavioral disorders: Secondary | ICD-10-CM | POA: Diagnosis not present

## 2015-09-27 DIAGNOSIS — I891 Lymphangitis: Secondary | ICD-10-CM

## 2015-09-27 DIAGNOSIS — L988 Other specified disorders of the skin and subcutaneous tissue: Secondary | ICD-10-CM | POA: Diagnosis present

## 2015-09-27 MED ORDER — CLINDAMYCIN HCL 150 MG PO CAPS: 300.0000 mg | ORAL_CAPSULE | Freq: Three times a day (TID) | ORAL | Status: DC

## 2015-09-27 MED ORDER — CLINDAMYCIN HCL 150 MG PO CAPS
300.0000 mg | ORAL_CAPSULE | Freq: Once | ORAL | Status: AC
  Administered 2015-09-27: 300 mg via ORAL
  Filled 2015-09-27: qty 2

## 2015-09-27 MED ORDER — TETANUS-DIPHTH-ACELL PERTUSSIS 5-2.5-18.5 LF-MCG/0.5 IM SUSP
0.5000 mL | Freq: Once | INTRAMUSCULAR | Status: AC
  Administered 2015-09-27: 0.5 mL via INTRAMUSCULAR
  Filled 2015-09-27: qty 0.5

## 2015-09-27 NOTE — Discharge Instructions (Signed)
Take the antibiotic as directed. Take ibuprofen in addition for pain and inflammation. Follow up with your regular or return here in 2 days for recheck. Return sooner for worsening symptoms.   Cellulitis Cellulitis is an infection of the skin and the tissue beneath it. The infected area is usually red and tender. Cellulitis occurs most often in the arms and lower legs.  CAUSES  Cellulitis is caused by bacteria that enter the skin through cracks or cuts in the skin. The most common types of bacteria that cause cellulitis are staphylococci and streptococci. SIGNS AND SYMPTOMS   Redness and warmth.  Swelling.  Tenderness or pain.  Fever. DIAGNOSIS  Your health care provider can usually determine what is wrong based on a physical exam. Blood tests may also be done. TREATMENT  Treatment usually involves taking an antibiotic medicine. HOME CARE INSTRUCTIONS   Take your antibiotic medicine as directed by your health care provider. Finish the antibiotic even if you start to feel better.  Keep the infected arm or leg elevated to reduce swelling.  Apply a warm cloth to the affected area up to 4 times per day to relieve pain.  Take medicines only as directed by your health care provider.  Keep all follow-up visits as directed by your health care provider. SEEK MEDICAL CARE IF:   You notice red streaks coming from the infected area.  Your red area gets larger or turns dark in color.  Your bone or joint underneath the infected area becomes painful after the skin has healed.  Your infection returns in the same area or another area.  You notice a swollen bump in the infected area.  You develop new symptoms.  You have a fever. SEEK IMMEDIATE MEDICAL CARE IF:   You feel very sleepy.  You develop vomiting or diarrhea.  You have a general ill feeling (malaise) with muscle aches and pains.   This information is not intended to replace advice given to you by your health care provider.  Make sure you discuss any questions you have with your health care provider.   Document Released: 07/26/2005 Document Revised: 07/07/2015 Document Reviewed: 01/01/2012 Elsevier Interactive Patient Education [employer]2016 Else[employer] Inc[employer]

## 2015-09-27 NOTE — ED Notes (Signed)
Declined W/C at D/C and was escorted to lobby by RN. 

## 2015-09-27 NOTE — ED Provider Notes (Signed)
CSN: [id number]     Arrival date & time 09/27/15  1717 History  By signing my name below, I, Jarvis Morgan, attest that this documentation has been prepared under the direction and in the presence of Kerrie Buffalo, NP Electronically Signed: Jarvis Morgan, ED Scribe. 09/27/2015. 11:36 PM.    Chief Complaint  Patient presents with  . Skin Problem   The history is provided by the patient. No language interpreter was used.    HPI Comments: Blake Pope is a 19 y.o. male who presents to the Emergency Department complaining of a possible spider bite that he noticed 1 day ago. He reports the area is small, painful, and itchy with 2 red streaks up his arm. Pt notes yesterday he was scratching the area last night and the bump opened up and began to drain; he reports the area is now scabbed over  He states the area is not painful until he touches it in which the pain becomes 8/10. He has not taken any medications prior to arrival. Pt reports he does not take any daily medications other than his synthroid. He denies any fever, chills, nausea, vomiting, HA or other associated symptoms.      Past Medical History  Diagnosis Date  . Physical growth delay   . Goiter   . Hypothyroidism, acquired, autoimmune   . Thyroiditis, autoimmune   . Poor appetite   . Trichotillomania   . ADHD (attention deficit hyperactivity disorder)   . Behavioral problems    Past Surgical History  Procedure Laterality Date  . Lacrimal duct surgery     Family History  Problem Relation Age of Onset  . Cancer Mother   . Hypertension Mother   . Diabetes Paternal Grandmother    Social History  Substance Use Topics  . Smoking status: Passive Smoke Exposure - Never Smoker  . Smokeless tobacco: Never Used  . Alcohol Use: No    Review of Systems  Skin: Positive for color change and wound.  All other systems reviewed and are negative.     Allergies  Bee venom  Home Medications   Prior to Admission  medications   Medication Sig Start Date End Date Taking? Authorizing Provider  hydrocortisone 2.5 % lotion Apply topically 2 (two) times daily. Apply to rash. Do not apply to face. 09/24/15  Yes Antony Madura, PA-C  ibuprofen (ADVIL,MOTRIN) 200 MG tablet Take 400 mg by mouth every 6 (six) hours as needed for moderate pain.   Yes Historical Provider, MD  traMADol (ULTRAM) 50 MG tablet Take 1 tablet (50 mg total) by mouth every 6 (six) hours as needed for moderate pain. 06/19/15  Yes [name], MD  clindamycin (CLEOCIN) 150 MG capsule Take 2 capsules (300 mg total) by mouth 3 (three) times daily. 09/27/15   Danis Pembleton Orlene Och, NP  SYNTHROID 75 MCG tablet Take 1 tablet (75 mcg total) by mouth daily before breakfast. Take one 75 mcg tablet of brand Synthroid daily. 11/10/14 11/11/15  David Stall, MD   Triage Vitals: BP 122/71 mmHg  Pulse 96  Temp(Src) 98.3 F (36.8 C) (Oral)  Resp 16  Ht  (1.626 m)  Wt 120 lb 9.6 oz (54.704 kg)  BMI 20.69 kg/m2  SpO2 97%  Physical Exam  Constitutional: He is oriented to person, place, and time. He appears well-developed and well-nourished.  HENT:  Head: Normocephalic and atraumatic.  Eyes: EOM are normal. Pupils are equal, round, and reactive to light.  Neck: Neck supple.  Cardiovascular: Normal rate and regular rhythm.   Pulses:      Radial pulses are 2+ on the right side, and 2+ on the left side.  Adequate circulation throughout  Pulmonary/Chest: No respiratory distress. He has no wheezes.  Abdominal: Soft. There is no tenderness.  Musculoskeletal: Normal range of motion. He exhibits no edema.  Full ROM of left anterior forearm  Lymphadenopathy:    He has no axillary adenopathy.  No axillary tenderness or lymphadenopathy  Neurological: He is alert and oriented to person, place, and time. No cranial nerve deficit.  Skin: Skin is warm and dry.  1.5 cm area to the left forearm at the radial aspect with a pustular center and 2 red streaks that go to  upper arm, there is tenderness to the forearm but none to antecubital or upper arm  Psychiatric: He has a normal mood and affect.  Nursing note and vitals reviewed.   ED Course  Procedures (including critical care time)  DIAGNOSTIC STUDIES: Oxygen Saturation is 97% on RA, normal by my interpretation.    COORDINATION OF CARE: 6:43 PM- Dr. Tyrone AppleEmily Nguyen went in to examine pt and agrees to have pt treated out patient for lymphangitis with Clindamycin.  Pt advised of plan for treatment and pt agrees.     MDM  19 y.o. male with infected lesion of left forearm x 2 days stable for d/c without fever and does not appear toxic. Will treat with antibiotics and patient to have follow up with his PCP in 2 days or return here sooner for worsening symptoms. Discussed with the patient and all questioned fully answered.   Final diagnoses:  Lymphangitis  Infected insect bite   I personally performed the services described in this documentation, which was scribed in my presence. The recorded information has been reviewed and is accurate.    MoccasinHope M Shavana Calder, NP 09/27/15 2340  Leta BaptistEmily Roe Nguyen, MD 09/28/15 [phone]

## 2015-09-27 NOTE — ED Notes (Signed)
PT reports he thinks a spider bite is present on Lt anterior forearm . Pt reports on Sunday he had a small  Itchy bump and today it is worse.

## 2015-11-03 IMAGING — CT CT MAXILLOFACIAL W/ CM
2 of 5 series · 3 of 47 positions shown, 4 images · IV contrast (omnipaque)
Comparison: CT head November 15, 2005

CLINICAL DATA: Swelling, pain and pus on RIGHT side of nose,
ascending toward the eye. History of hypothyroidism.

EXAM:
CT MAXILLOFACIAL WITH CONTRAST
TECHNIQUE: Multidetector CT imaging of the maxillofacial structures was
performed with intravenous contrast. Multiplanar CT image
reconstructions were also generated. A small metallic BB was placed
on the right temple in order to reliably differentiate right from
left.
CONTRAST:  75mL OMNIPAQUE IOHEXOL 300 MG/ML  SOLN

[Series 209: coronal · coronal · 0.39mm/px · 2 of 67 slices shown, 3 images]
[im 23/67  brain]
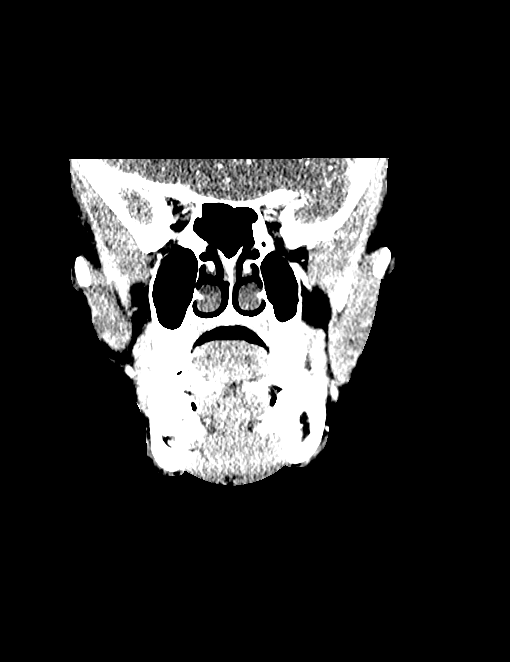
[im 23/67  bone]
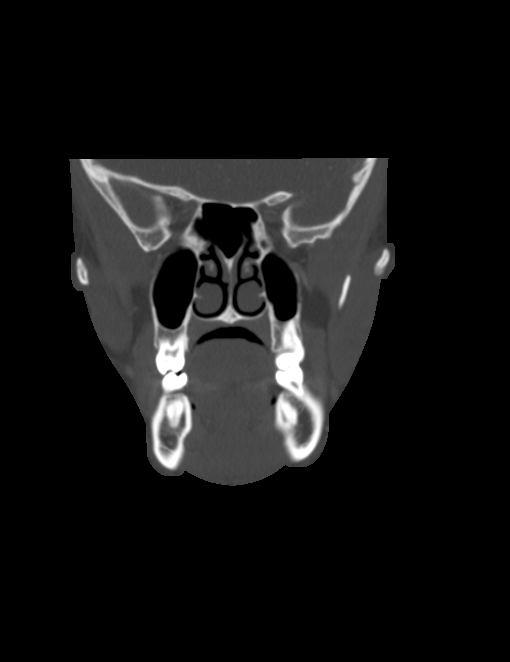
[im 45/67  bone]
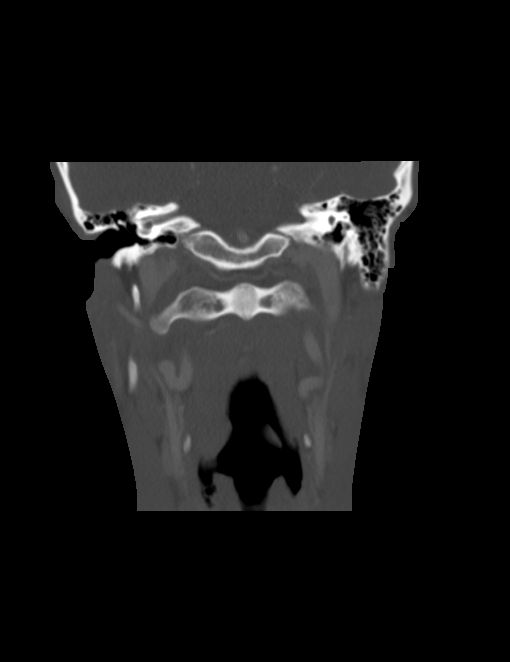

[Series 2010: sag · sagittal · 0.39mm/px · 1 of 70 slices shown]
[im 35/70  bone]
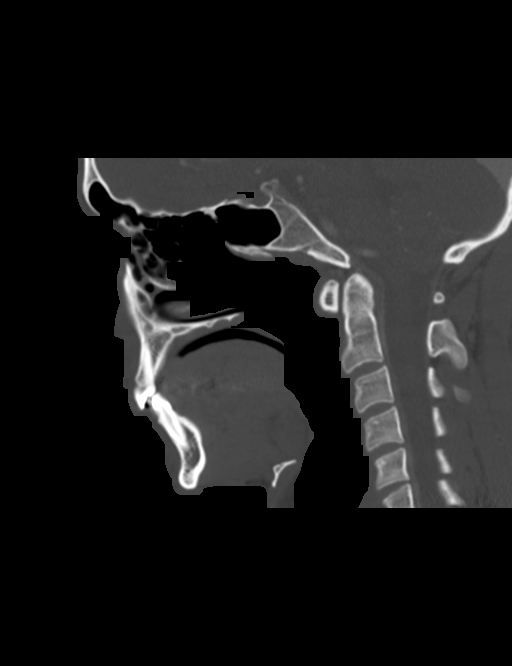

[3 of 47 positions shown; findings below may reference images not displayed]

FINDINGS: RIGHT facial/ nasal labial and nasal soft tissue swelling extending
to the medial and infraorbital soft tissues. Hyperemia, without
abscess, free fluid, subcutaneous gas or radiopaque foreign bodies.

Ocular globes and orbital contents are unremarkable.

No destructive bony lesions. No facial fracture. Paranasal sinuses
and mastoid air cells are well aerated.
IMPRESSION: RIGHT facial/periorbital cellulitis without abscess or postseptal
extent.

## 2015-11-06 ENCOUNTER — Emergency Department (HOSPITAL_COMMUNITY): Payer: Medicaid Other

## 2015-11-06 ENCOUNTER — Emergency Department (HOSPITAL_COMMUNITY)
Admission: EM | Admit: 2015-11-06 | Discharge: 2015-11-06 | Disposition: A | Discharge status: Home / Self Care | Payer: Medicaid Other | Attending: Emergency Medicine | Admitting: Emergency Medicine

## 2015-11-06 ENCOUNTER — Encounter (HOSPITAL_COMMUNITY): Payer: Self-pay | Admitting: *Deleted

## 2015-11-06 DIAGNOSIS — R509 Fever, unspecified: Secondary | ICD-10-CM | POA: Diagnosis not present

## 2015-11-06 DIAGNOSIS — Z7952 Long term (current) use of systemic steroids: Secondary | ICD-10-CM | POA: Insufficient documentation

## 2015-11-06 DIAGNOSIS — R531 Weakness: Secondary | ICD-10-CM | POA: Insufficient documentation

## 2015-11-06 DIAGNOSIS — R000 Tachycardia, unspecified: Secondary | ICD-10-CM | POA: Insufficient documentation

## 2015-11-06 DIAGNOSIS — Z792 Long term (current) use of antibiotics: Secondary | ICD-10-CM | POA: Diagnosis not present

## 2015-11-06 DIAGNOSIS — R51 Headache: Secondary | ICD-10-CM | POA: Insufficient documentation

## 2015-11-06 DIAGNOSIS — R0602 Shortness of breath: Secondary | ICD-10-CM | POA: Insufficient documentation

## 2015-11-06 DIAGNOSIS — J029 Acute pharyngitis, unspecified: Secondary | ICD-10-CM | POA: Insufficient documentation

## 2015-11-06 DIAGNOSIS — R0981 Nasal congestion: Secondary | ICD-10-CM | POA: Insufficient documentation

## 2015-11-06 DIAGNOSIS — R61 Generalized hyperhidrosis: Secondary | ICD-10-CM | POA: Diagnosis not present

## 2015-11-06 DIAGNOSIS — R109 Unspecified abdominal pain: Secondary | ICD-10-CM | POA: Insufficient documentation

## 2015-11-06 DIAGNOSIS — E039 Hypothyroidism, unspecified: Secondary | ICD-10-CM | POA: Diagnosis not present

## 2015-11-06 DIAGNOSIS — R110 Nausea: Secondary | ICD-10-CM | POA: Insufficient documentation

## 2015-11-06 DIAGNOSIS — Z8659 Personal history of other mental and behavioral disorders: Secondary | ICD-10-CM | POA: Diagnosis not present

## 2015-11-06 DIAGNOSIS — R05 Cough: Secondary | ICD-10-CM | POA: Diagnosis not present

## 2015-11-06 DIAGNOSIS — J3489 Other specified disorders of nose and nasal sinuses: Secondary | ICD-10-CM | POA: Insufficient documentation

## 2015-11-06 DIAGNOSIS — R6889 Other general symptoms and signs: Secondary | ICD-10-CM

## 2015-11-06 DIAGNOSIS — Z79899 Other long term (current) drug therapy: Secondary | ICD-10-CM | POA: Diagnosis not present

## 2015-11-06 MED ORDER — ONDANSETRON 8 MG PO TBDP: 8.0000 mg | ORAL_TABLET | Freq: Three times a day (TID) | ORAL | Status: DC | PRN

## 2015-11-06 MED ORDER — IBUPROFEN 400 MG PO TABS
600.0000 mg | ORAL_TABLET | Freq: Once | ORAL | Status: AC
  Administered 2015-11-06: 600 mg via ORAL
  Filled 2015-11-06: qty 1

## 2015-11-06 MED ORDER — IBUPROFEN 400 MG PO TABS: 400.0000 mg | ORAL_TABLET | Freq: Four times a day (QID) | ORAL | Status: DC | PRN

## 2015-11-06 MED ORDER — OSELTAMIVIR PHOSPHATE 75 MG PO CAPS: 75.0000 mg | ORAL_CAPSULE | Freq: Two times a day (BID) | ORAL | Status: DC

## 2015-11-06 NOTE — ED Notes (Signed)
Med and water to drink

## 2015-11-06 NOTE — ED Notes (Signed)
No problem with fluids

## 2015-11-06 NOTE — Discharge Instructions (Signed)
We think what you have is a viral syndrome, possibly from influenza - the treatment for which is symptomatic relief only, and your body will fight the infection off in a few days. We are prescribing you some meds for pain and fevers.  ALSO WE ARE GIVING YOU TAMIFLU - WHICH DOESN'T CURE FLU. It reduced your symptoms by 24 hours. See your primary care doctor in 1 week if the symptoms dont improve.  Upper Respiratory Infection, Adult Most upper respiratory infections (URIs) are a viral infection of the air passages leading to the lungs. A URI affects the nose, throat, and upper air passages. The most common type of URI is nasopharyngitis and is typically referred to as "the common cold." URIs run their course and usually go away on their own. Most of the time, a URI does not require medical attention, but sometimes a bacterial infection in the upper airways can follow a viral infection. This is called a secondary infection. Sinus and middle ear infections are common types of secondary upper respiratory infections. Bacterial pneumonia can also complicate a URI. A URI can worsen asthma and chronic obstructive pulmonary disease (COPD). Sometimes, these complications can require emergency medical care and may be life threatening.  CAUSES Almost all URIs are caused by viruses. A virus is a type of germ and can spread from one person to another.  RISKS FACTORS You may be at risk for a URI if:   You smoke.   You have chronic heart or lung disease.  You have a weakened defense (immune) system.   You are very young or very old.   You have nasal allergies or asthma.  You work in crowded or poorly ventilated areas.  You work in health care facilities or schools. SIGNS AND SYMPTOMS  Symptoms typically develop 2-3 days after you come in contact with a cold virus. Most viral URIs last 7-10 days. However, viral URIs from the influenza virus (flu virus) can last 14-18 days and are typically more  severe. Symptoms may include:   Runny or stuffy (congested) nose.   Sneezing.   Cough.   Sore throat.   Headache.   Fatigue.   Fever.   Loss of appetite.   Pain in your forehead, behind your eyes, and over your cheekbones (sinus pain).  Muscle aches.  DIAGNOSIS  Your health care provider may diagnose a URI by:  Physical exam.  Tests to check that your symptoms are not due to another condition such as:  Strep throat.  Sinusitis.  Pneumonia.  Asthma. TREATMENT  A URI goes away on its own with time. It cannot be cured with medicines, but medicines may be prescribed or recommended to relieve symptoms. Medicines may help:  Reduce your fever.  Reduce your cough.  Relieve nasal congestion. HOME CARE INSTRUCTIONS   Take medicines only as directed by your health care provider.   Gargle warm saltwater or take cough drops to comfort your throat as directed by your health care provider.  Use a warm mist humidifier or inhale steam from a shower to increase air moisture. This may make it easier to breathe.  Drink enough fluid to keep your urine clear or pale yellow.   Eat soups and other clear broths and maintain good nutrition.   Rest as needed.   Return to work when your temperature has returned to normal or as your health care provider advises. You may need to stay home longer to avoid infecting others. You can also use a  face mask and careful hand washing to prevent spread of the virus.  Increase the usage of your inhaler if you have asthma.   Do not use any tobacco products, including cigarettes, chewing tobacco, or electronic cigarettes. If you need help quitting, ask your health care provider. PREVENTION  The best way to protect yourself from getting a cold is to practice good hygiene.   Avoid oral or hand contact with people with cold symptoms.   Wash your hands often if contact occurs.  There is no clear evidence that vitamin C, vitamin  E, echinacea, or exercise reduces the chance of developing a cold. However, it is always recommended to get plenty of rest, exercise, and practice good nutrition.  SEEK MEDICAL CARE IF:   You are getting worse rather than better.   Your symptoms are not controlled by medicine.   You have chills.  You have worsening shortness of breath.  You have brown or red mucus.  You have yellow or brown nasal discharge.  You have pain in your face, especially when you bend forward.  You have a fever.  You have swollen neck glands.  You have pain while swallowing.  You have white areas in the back of your throat. SEEK IMMEDIATE MEDICAL CARE IF:   You have severe or persistent:  Headache.  Ear pain.  Sinus pain.  Chest pain.  You have chronic lung disease and any of the following:  Wheezing.  Prolonged cough.  Coughing up blood.  A change in your usual mucus.  You have a stiff neck.  You have changes in your:  Vision.  Hearing.  Thinking.  Mood. MAKE SURE YOU:   Understand these instructions.  Will watch your condition.  Will get help right away if you are not doing well or get worse.   This information is not intended to replace advice given to you by your health care provider. Make sure you discuss any questions you have with your health care provider.   Document Released: 04/11/2001 Document Revised: 03/02/2015 Document Reviewed: 01/21/2014 Elsevier Interactive Patient Education [employer]2016 [employer] Inc[employer]

## 2015-11-06 NOTE — ED Provider Notes (Signed)
CSN: [id number]     Arrival date & time 11/06/15  0009 History  By signing my name below, I, Bethel Born, attest that this documentation has been prepared under the direction and in the presence of Derwood Kaplan, MD. Electronically Signed: Bethel Born, ED Scribe. 11/06/2015. 12:43 AM    Chief Complaint  Patient presents with  . Cough   The history is provided by the patient. No language interpreter was used.   Blake Pope is a 20 y.o. male who presents to the Emergency Department complaining of a cough productive of white/green sputum with onset 1 week ago. Associated symptoms include generalized weakness, fever up to 102 F (treated with Tylenol), chills, headache, nasal congestion, rhinorrhea, sore throat, SOB, abdominal pain, and nausea. The associated symptoms started today and the chills and abdominal pain worsened around 2:30 PM. Pt denies chest pain and vomiting. He smokes 1/2 PPD. No history of COPD or asthma. His girlfriend had a viral URI last week.   Past Medical History  Diagnosis Date  . Physical growth delay   . Goiter   . Hypothyroidism, acquired, autoimmune   . Thyroiditis, autoimmune   . Poor appetite   . Trichotillomania   . ADHD (attention deficit hyperactivity disorder)   . Behavioral problems    Past Surgical History  Procedure Laterality Date  . Lacrimal duct surgery     Family History  Problem Relation Age of Onset  . Cancer Mother   . Hypertension Mother   . Diabetes Paternal Grandmother    Social History  Substance Use Topics  . Smoking status: Passive Smoke Exposure - Never Smoker  . Smokeless tobacco: Never Used  . Alcohol Use: No    Review of Systems  Constitutional: Positive for fever, chills and diaphoresis.  HENT: Positive for congestion, rhinorrhea and sore throat.   Respiratory: Positive for cough and shortness of breath.   Cardiovascular: Negative for chest pain.  Gastrointestinal: Positive for nausea and abdominal pain.  Negative for vomiting.  Neurological: Positive for weakness.   10 Systems reviewed and all are negative for acute change except as noted in the HPI.   Allergies  Bee venom  Home Medications   Prior to Admission medications   Medication Sig Start Date End Date Taking? Authorizing Provider  clindamycin (CLEOCIN) 150 MG capsule Take 2 capsules (300 mg total) by mouth 3 (three) times daily. 09/27/15   Hope Orlene Och, NP  hydrocortisone 2.5 % lotion Apply topically 2 (two) times daily. Apply to rash. Do not apply to face. 09/24/15   Antony Madura, PA-C  ibuprofen (ADVIL,MOTRIN) 400 MG tablet Take 1 tablet (400 mg total) by mouth every 6 (six) hours as needed. 11/06/15   Derwood Kaplan, MD  ondansetron (ZOFRAN ODT) 8 MG disintegrating tablet Take 1 tablet (8 mg total) by mouth every 8 (eight) hours as needed for nausea. 11/06/15   Derwood Kaplan, MD  oseltamivir (TAMIFLU) 75 MG capsule Take 1 capsule (75 mg total) by mouth every 12 (twelve) hours. 11/06/15   Derwood Kaplan, MD  SYNTHROID 75 MCG tablet Take 1 tablet (75 mcg total) by mouth daily before breakfast. Take one 75 mcg tablet of brand Synthroid daily. 11/10/14 11/11/15  David Stall, MD  traMADol (ULTRAM) 50 MG tablet Take 1 tablet (50 mg total) by mouth every 6 (six) hours as needed for moderate pain. 06/19/15   [name], MD   BP 118/77 mmHg  Pulse 122  Temp(Src) 100 F (37.8 C) (Oral)  Resp  18  SpO2 96% Physical Exam  Constitutional: He is oriented to person, place, and time. He appears well-developed and well-nourished.  HENT:  Head: Normocephalic and atraumatic.  Posterior pharynx has erythema but no exudate or enlargement  Eyes: EOM are normal.  Neck: Normal range of motion.  Cardiovascular: Regular rhythm, normal heart sounds and intact distal pulses.  Tachycardia present.   Pulmonary/Chest: Effort normal and breath sounds normal. No respiratory distress.  CTAB  Abdominal: Soft. He exhibits no distension. There is no  tenderness. There is no rebound and no guarding.  No focal abdominal tenderness  Musculoskeletal: Normal range of motion.  Lymphadenopathy:       Head (right side): No preauricular and no posterior auricular adenopathy present.       Head (left side): No preauricular and no posterior auricular adenopathy present.    He has no cervical adenopathy.  Neurological: He is alert and oriented to person, place, and time.  Skin: Skin is warm and dry.  Skin warm to touch Capillary refill less than 3 seconds  Psychiatric: He has a normal mood and affect. Judgment normal.  Nursing note and vitals reviewed.   ED Course  Procedures (including critical care time) DIAGNOSTIC STUDIES: Oxygen Saturation is 96% on RA,  normal by my interpretation.    COORDINATION OF CARE: 12:40 AM Discussed treatment plan which includes CXR and ibuprofen with pt at bedside and pt agreed to plan.  Labs Review Labs Reviewed - No data to display  Imaging Review Dg Chest 2 View  11/06/2015  CLINICAL DATA:  Acute onset of cough and fever.  Initial encounter. EXAM: CHEST  2 VIEW COMPARISON:  Chest radiograph performed 03/13/2012 FINDINGS: The lungs are well-aerated and clear. There is no evidence of focal opacification, pleural effusion or pneumothorax. The heart is normal in size; the mediastinal contour is within normal limits. No acute osseous abnormalities are seen. IMPRESSION: No acute cardiopulmonary process seen. Electronically Signed   By: Roanna RaiderJeffery  Chang M.D.   On: 11/06/2015 01:39   I have personally reviewed and evaluated these images as part of my medical decision-making.   EKG Interpretation None      MDM   Final diagnoses:  Flu-like symptoms   I personally performed the services described in this documentation, which was scribed in my presence. The recorded information has been reviewed and is accurate.  Pt with URI like sx, cough, body aches. Has a low grade temp here and tachycardiac. Cough is old,  but other symptoms are new. Possible flu. Immunocompetent and non toxic appearing, will d.c - tamiflu given and he is made aware of the fact that it will not treat the flu.   Derwood KaplanAnkit Shatori Bertucci, MD 11/06/15 [phone]

## 2015-11-06 NOTE — ED Notes (Signed)
The pt is here with a cold  Cough and a temp since 1430 today.  He had tylenol  ?? One hour ago.  He has  Had a cough for one week  White sputum.  He does smoke

## 2016-12-16 ENCOUNTER — Encounter (HOSPITAL_COMMUNITY): Payer: Self-pay | Admitting: Emergency Medicine

## 2016-12-16 ENCOUNTER — Emergency Department (HOSPITAL_COMMUNITY)
Admission: EM | Admit: 2016-12-16 | Discharge: 2016-12-17 | Disposition: A | Discharge status: Home / Self Care | Payer: Self-pay | Attending: Emergency Medicine | Admitting: Emergency Medicine

## 2016-12-16 DIAGNOSIS — F17210 Nicotine dependence, cigarettes, uncomplicated: Secondary | ICD-10-CM | POA: Insufficient documentation

## 2016-12-16 DIAGNOSIS — W3400XA Accidental discharge from unspecified firearms or gun, initial encounter: Secondary | ICD-10-CM | POA: Insufficient documentation

## 2016-12-16 DIAGNOSIS — S71101A Unspecified open wound, right thigh, initial encounter: Secondary | ICD-10-CM | POA: Insufficient documentation

## 2016-12-16 DIAGNOSIS — S81001A Unspecified open wound, right knee, initial encounter: Secondary | ICD-10-CM | POA: Insufficient documentation

## 2016-12-16 DIAGNOSIS — Y999 Unspecified external cause status: Secondary | ICD-10-CM | POA: Insufficient documentation

## 2016-12-16 DIAGNOSIS — Y929 Unspecified place or not applicable: Secondary | ICD-10-CM | POA: Insufficient documentation

## 2016-12-16 DIAGNOSIS — Y939 Activity, unspecified: Secondary | ICD-10-CM | POA: Insufficient documentation

## 2016-12-16 HISTORY — DX: Disorder of thyroid, unspecified: E07.9

## 2016-12-16 LAB — PREPARE FRESH FROZEN PLASMA
BLOOD PRODUCT EXPIRATION DATE: 201802182359
Blood Product Expiration Date: 201803122359
ISSUE DATE / TIME: 201802172331
ISSUE DATE / TIME: 201802172331
Unit Type and Rh: 600
Unit Type and Rh: 6200
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
## 2016-12-17 NOTE — ED Notes (Signed)
No IV per Dr. Bebe ShaggyWickline

## 2016-12-17 NOTE — ED Notes (Signed)
MD Wickline at bedside. 

## 2016-12-17 NOTE — ED Notes (Signed)
Patient left at this time with all belongings, refused wheelchair. 
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
## 2016-12-18 ENCOUNTER — Encounter (HOSPITAL_COMMUNITY): Payer: Self-pay | Admitting: *Deleted

## 2017-06-11 ENCOUNTER — Emergency Department (HOSPITAL_COMMUNITY)
Admission: EM | Admit: 2017-06-11 | Discharge: 2017-06-11 | Disposition: A | Discharge status: Home / Self Care | Payer: Medicaid Other | Attending: Emergency Medicine | Admitting: Emergency Medicine

## 2017-06-11 DIAGNOSIS — Z5321 Procedure and treatment not carried out due to patient leaving prior to being seen by health care provider: Secondary | ICD-10-CM | POA: Insufficient documentation

## 2017-06-11 DIAGNOSIS — R45851 Suicidal ideations: Secondary | ICD-10-CM | POA: Insufficient documentation

## 2017-06-11 DIAGNOSIS — F419 Anxiety disorder, unspecified: Secondary | ICD-10-CM

## 2017-06-11 DIAGNOSIS — F329 Major depressive disorder, single episode, unspecified: Secondary | ICD-10-CM | POA: Insufficient documentation
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
## 2017-06-11 NOTE — ED Provider Notes (Signed)
Went to assess pt - pt had already left ED, indicating to staff that he did not want to give up his cell phone, that he would make appointment with his doctor tomorrow.    Cathren LaineSteinl, Wrigley Plasencia, MD 06/11/17 2216

## 2018-06-17 ENCOUNTER — Emergency Department (HOSPITAL_COMMUNITY)
Admission: EM | Admit: 2018-06-17 | Discharge: 2018-06-18 | Disposition: A | Discharge status: Home / Self Care | Payer: Self-pay | Attending: Emergency Medicine | Admitting: Emergency Medicine

## 2018-06-17 ENCOUNTER — Other Ambulatory Visit: Payer: Self-pay

## 2018-06-17 ENCOUNTER — Encounter (HOSPITAL_COMMUNITY): Payer: Self-pay | Admitting: Emergency Medicine

## 2018-06-17 DIAGNOSIS — E039 Hypothyroidism, unspecified: Secondary | ICD-10-CM | POA: Insufficient documentation

## 2018-06-17 DIAGNOSIS — F17210 Nicotine dependence, cigarettes, uncomplicated: Secondary | ICD-10-CM | POA: Insufficient documentation

## 2018-06-17 DIAGNOSIS — M25519 Pain in unspecified shoulder: Secondary | ICD-10-CM | POA: Insufficient documentation

## 2018-06-17 DIAGNOSIS — F909 Attention-deficit hyperactivity disorder, unspecified type: Secondary | ICD-10-CM | POA: Insufficient documentation

## 2018-06-17 DIAGNOSIS — M545 Low back pain: Secondary | ICD-10-CM | POA: Insufficient documentation
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
## 2018-06-18 MED ORDER — ACETAMINOPHEN 325 MG PO TABS
650.0000 mg | ORAL_TABLET | Freq: Once | ORAL | Status: AC
  Administered 2018-06-18: 650 mg via ORAL
  Filled 2018-06-18: qty 2

## 2018-06-18 NOTE — ED Notes (Signed)
Patient verbalizes understanding of discharge instructions. Opportunity for questioning and answers were provided. Pt discharged from ED ambulatory.  
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
## 2018-06-18 NOTE — Discharge Instructions (Addendum)
Alternate ibuprofen or Tylenol for pain.Apply heat to the area. Please follow up with PCP in 1 week for reevaluation of symptoms.  °

## 2019-05-12 ENCOUNTER — Encounter (HOSPITAL_COMMUNITY): Payer: Self-pay

## 2019-05-12 ENCOUNTER — Other Ambulatory Visit: Payer: Self-pay

## 2019-05-12 ENCOUNTER — Emergency Department (HOSPITAL_COMMUNITY)
Admission: EM | Admit: 2019-05-12 | Discharge: 2019-05-12 | Disposition: A | Discharge status: Home / Self Care | Payer: HRSA Program | Attending: Emergency Medicine | Admitting: Emergency Medicine

## 2019-05-12 DIAGNOSIS — F17210 Nicotine dependence, cigarettes, uncomplicated: Secondary | ICD-10-CM | POA: Diagnosis not present

## 2019-05-12 DIAGNOSIS — E039 Hypothyroidism, unspecified: Secondary | ICD-10-CM | POA: Diagnosis not present

## 2019-05-12 DIAGNOSIS — J029 Acute pharyngitis, unspecified: Secondary | ICD-10-CM

## 2019-05-12 DIAGNOSIS — Z79899 Other long term (current) drug therapy: Secondary | ICD-10-CM | POA: Diagnosis not present

## 2019-05-12 DIAGNOSIS — Z20828 Contact with and (suspected) exposure to other viral communicable diseases: Secondary | ICD-10-CM | POA: Insufficient documentation

## 2019-05-12 LAB — GROUP A STREP BY PCR: Group A Strep by PCR: NOT DETECTED

## 2019-05-12 NOTE — Discharge Instructions (Addendum)
Please read attached information. If you experience any new or worsening signs or symptoms please return to the emergency room for evaluation. Please follow-up with your primary care provider or specialist as discussed.  °

## 2019-05-12 NOTE — ED Notes (Signed)
Pt discharged from ED; instructions provided; Pt encouraged to return to ED if symptoms worsen and to f/u with PCP; Pt verbalized understanding of all instructions 

## 2019-05-12 NOTE — ED Triage Notes (Signed)
Pt reports having a positive covid contact and now reports a scratchy throat. No fever.
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
## 2019-05-15 LAB — NOVEL CORONAVIRUS, NAA (HOSP ORDER, SEND-OUT TO REF LAB; TAT 18-24 HRS): SARS-CoV-2, NAA: NOT DETECTED

## 2019-06-09 ENCOUNTER — Encounter (HOSPITAL_COMMUNITY): Payer: Self-pay | Admitting: Emergency Medicine

## 2019-06-09 ENCOUNTER — Other Ambulatory Visit: Payer: Self-pay

## 2019-06-09 ENCOUNTER — Emergency Department (HOSPITAL_COMMUNITY)
Admission: EM | Admit: 2019-06-09 | Discharge: 2019-06-09 | Disposition: A | Discharge status: Home / Self Care | Payer: HRSA Program | Attending: Emergency Medicine | Admitting: Emergency Medicine

## 2019-06-09 DIAGNOSIS — Z79899 Other long term (current) drug therapy: Secondary | ICD-10-CM | POA: Diagnosis not present

## 2019-06-09 DIAGNOSIS — E039 Hypothyroidism, unspecified: Secondary | ICD-10-CM | POA: Diagnosis not present

## 2019-06-09 DIAGNOSIS — F17210 Nicotine dependence, cigarettes, uncomplicated: Secondary | ICD-10-CM | POA: Diagnosis not present

## 2019-06-09 DIAGNOSIS — Z20828 Contact with and (suspected) exposure to other viral communicable diseases: Secondary | ICD-10-CM | POA: Insufficient documentation

## 2019-06-09 DIAGNOSIS — Z20822 Contact with and (suspected) exposure to covid-19: Secondary | ICD-10-CM

## 2019-06-09 DIAGNOSIS — Z1159 Encounter for screening for other viral diseases: Secondary | ICD-10-CM | POA: Diagnosis present

## 2019-06-09 NOTE — ED Triage Notes (Signed)
Pt reports that his boss had a close encounter with someone positive for covid and he was told by his boss to come in for testing. Pt has no symptoms.

## 2019-06-09 NOTE — ED Notes (Signed)
Patient verbalizes understanding of discharge instructions. Opportunity for questioning and answers were provided. Armband removed by staff, pt discharged from ED.  

## 2019-06-09 NOTE — ED Provider Notes (Signed)
[facility] EMERGENCY DEPARTMENT Provider Note   CSN: [id number] Arrival date & time: 06/09/19  1628     History   Chief Complaint Chief Complaint  Patient presents with  . COVID Testing    HPI Blake Pope is a 23 y.o. male.     Patient presents emergency department today requesting COVID-19 testing.  Patient states that he had a close exposure to his boss who had a high risk exposure to his girlfriend who has tested positive for coronavirus.  Patient has a sore throat but states that he has had this for years and this is unchanged.  No treatments prior to arrival.  States that he cannot go back to work until he has testing performed.     Past Medical History:  Diagnosis Date  . ADHD (attention deficit hyperactivity disorder)   . Behavioral problems   . Goiter   . Hypothyroidism, acquired, autoimmune   . Physical growth delay   . Poor appetite   . Thyroid disease   . Thyroiditis, autoimmune   . Trichotillomania     Patient Active Problem List   Diagnosis Date Noted  . Weight loss 10/28/2012  . Physical growth delay   . Goiter   . Hypothyroidism, acquired, autoimmune   . Thyroiditis, autoimmune   . Trichotillomania   . ADHD (attention deficit hyperactivity disorder)   . Behavioral problems   . 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 lacrimal duct surgery          Home Medications    Prior to Admission medications   Medication Sig Start Date End Date Taking? Authorizing Provider  clindamycin (CLEOCIN) 150 MG capsule Take 2 capsules (300 mg total) by mouth 3 (three) times daily. 09/27/15   Ashley Murrain, NP  hydrocortisone 2.5 % lotion Apply topically 2 (two) times daily. Apply to rash. Do not apply to face. 09/24/15   Antonietta Breach, PA-C  ibuprofen (ADVIL,MOTRIN) 400 MG tablet Take 1 tablet (400 mg total) by mouth every 6 (six) hours as needed.  11/06/15   Varney Biles, MD  ondansetron (ZOFRAN ODT) 8 MG disintegrating tablet Take 1 tablet (8 mg total) by mouth every 8 (eight) hours as needed for nausea. 11/06/15   Varney Biles, MD  oseltamivir (TAMIFLU) 75 MG capsule Take 1 capsule (75 mg total) by mouth every 12 (twelve) hours. 11/06/15   Varney Biles, MD  SYNTHROID 75 MCG tablet Take 1 tablet (75 mcg total) by mouth daily before breakfast. Take one 75 mcg tablet of brand Synthroid daily. 11/10/14 11/11/15  Sherrlyn Hock, MD  traMADol (ULTRAM) 50 MG tablet Take 1 tablet (50 mg total) by mouth every 6 (six) hours as needed for moderate pain. 06/19/15   Linton Flemings, MD    Family History Family History  Problem Relation Age of Onset  . Cancer Mother   . Hypertension Mother   . Diabetes Paternal Grandmother     Social History Social History   Tobacco Use  . Smoking status: Current Every Day Smoker    Packs/day: 1.00    Types: Cigarettes  . Smokeless tobacco: Never Used  Substance Use Topics  . Alcohol use: Yes  . Drug use: No     Allergies   Bee venom   Review of Systems Review of Systems  Constitutional: Negative for fever.  HENT: Positive for sore throat.   Respiratory: Negative for cough,  chest tightness and shortness of breath.   Gastrointestinal: Negative for diarrhea, nausea and vomiting.  Skin: Negative for rash.     Physical Exam Updated Vital Signs BP 116/73 (BP Location: Right Arm)   Pulse 81   Temp 98.6 F (37 C) (Oral)   Resp 18   SpO2 100%   Physical Exam Vitals signs and nursing note reviewed.  Constitutional:      Appearance: He is well-developed.  HENT:     Head: Normocephalic and atraumatic.     Mouth/Throat:     Mouth: Mucous membranes are moist.     Pharynx: Posterior oropharyngeal erythema present. No oropharyngeal exudate.     Comments: Pharyngeal erythema noted without exudate. Eyes:     Conjunctiva/sclera: Conjunctivae normal.  Neck:     Musculoskeletal: Normal range  of motion and neck supple.  Pulmonary:     Effort: No respiratory distress.  Skin:    General: Skin is warm and dry.  Neurological:     Mental Status: He is alert.      ED Treatments / Results  Labs (all labs ordered are listed, but only abnormal results are displayed) Labs Reviewed  NOVEL CORONAVIRUS, NAA (HOSPITAL ORDER, SEND-OUT TO REF LAB)    EKG None  Radiology No results found.  Procedures Procedures (including critical care time)  Medications Ordered in ED Medications - No data to display   Initial Impression / Assessment and Plan / ED Course  I have reviewed the triage vital signs and the nursing notes.  Pertinent labs & imaging results that were available during my care of the patient were reviewed by me and considered in my medical decision making (see chart for details).        Patient seen and examined.  Coronavirus testing ordered.  Patient counseled.  Vital signs reviewed and are as follows: BP 116/73 (BP Location: Right Arm)   Pulse 81   Temp 98.6 F (37 C) (Oral)   Resp 18   SpO2 100%     Final Clinical Impressions(s) / ED Diagnoses   Final diagnoses:  Close Exposure to Covid-19 Virus   Patient here for COVID-19 testing.  ED Discharge Orders    None       Renne CriglerGeiple, Norie Latendresse, Cordelia Poche-C 06/09/19 1844    Gerhard MunchLockwood, Robert, MD 06/09/19 2011

## 2019-06-10 LAB — NOVEL CORONAVIRUS, NAA (HOSP ORDER, SEND-OUT TO REF LAB; TAT 18-24 HRS): SARS-CoV-2, NAA: NOT DETECTED

## 2019-11-12 ENCOUNTER — Encounter (HOSPITAL_COMMUNITY): Payer: Self-pay | Admitting: Emergency Medicine

## 2019-11-12 ENCOUNTER — Other Ambulatory Visit: Payer: Self-pay

## 2019-11-12 ENCOUNTER — Emergency Department (HOSPITAL_COMMUNITY)
Admission: EM | Admit: 2019-11-12 | Discharge: 2019-11-12 | Disposition: A | Discharge status: Home / Self Care | Payer: Self-pay | Attending: Emergency Medicine | Admitting: Emergency Medicine

## 2019-11-12 DIAGNOSIS — Z20822 Contact with and (suspected) exposure to covid-19: Secondary | ICD-10-CM | POA: Insufficient documentation

## 2019-11-12 DIAGNOSIS — F17210 Nicotine dependence, cigarettes, uncomplicated: Secondary | ICD-10-CM | POA: Insufficient documentation

## 2019-11-12 DIAGNOSIS — Z79899 Other long term (current) drug therapy: Secondary | ICD-10-CM | POA: Insufficient documentation

## 2019-11-12 DIAGNOSIS — J069 Acute upper respiratory infection, unspecified: Secondary | ICD-10-CM | POA: Insufficient documentation

## 2019-11-12 DIAGNOSIS — F909 Attention-deficit hyperactivity disorder, unspecified type: Secondary | ICD-10-CM | POA: Insufficient documentation

## 2019-11-12 DIAGNOSIS — E038 Other specified hypothyroidism: Secondary | ICD-10-CM | POA: Insufficient documentation

## 2019-11-12 MED ORDER — BENZONATATE 100 MG PO CAPS: 100.0000 mg | ORAL_CAPSULE | Freq: Three times a day (TID) | ORAL | 0 refills | Status: AC

## 2019-11-12 MED ORDER — FLUTICASONE PROPIONATE 50 MCG/ACT NA SUSP: 2.0000 | Freq: Every day | NASAL | 0 refills | Status: DC

## 2019-11-12 NOTE — ED Provider Notes (Signed)
[facility] EMERGENCY DEPARTMENT Provider Note   CSN: [id number] Arrival date & time: 11/12/19  0801     History Chief Complaint  Patient presents with  . Sore Throat  . Nasal Congestion  . Cough    Blake Pope is a 24 y.o. male.  HPI   24 year old male with history of ADHD, hypothyroidism, who presents to the emergency department today for evaluation of rhinorrhea, nasal congestion, sore throat and dry cough that started last night.  He denies any nausea, diarrhea, chills.  He does report one episode of posttussis emesis that occurred last night.  He has since had no vomiting.  He denies any shortness of breath or chest pain.  Denies any known coronavirus exposures.  Past Medical History:  Diagnosis Date  . ADHD (attention deficit hyperactivity disorder)   . Behavioral problems   . Goiter   . Hypothyroidism, acquired, autoimmune   . Physical growth delay   . Poor appetite   . Thyroid disease   . Thyroiditis, autoimmune   . Trichotillomania     Patient Active Problem List   Diagnosis Date Noted  . Weight loss 10/28/2012  . Physical growth delay   . Goiter   . Hypothyroidism, acquired, autoimmune   . Thyroiditis, autoimmune   . Trichotillomania   . ADHD (attention deficit hyperactivity disorder)   . Behavioral problems   . 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 lacrimal duct surgery         Family History  Problem Relation Age of Onset  . Cancer Mother   . Hypertension Mother   . Diabetes Paternal Grandmother     Social History   Tobacco Use  . Smoking status: Current Every Day Smoker    Packs/day: 1.00    Types: Cigarettes  . Smokeless tobacco: Never Used  Substance Use Topics  . Alcohol use: Yes  . Drug use: No    Home Medications Prior to Admission medications   Medication Sig Start Date End Date Taking? Authorizing Provider    benzonatate (TESSALON) 100 MG capsule Take 1 capsule (100 mg total) by mouth every 8 (eight) hours for 5 days. 11/12/19 11/17/19  Latacha Texeira S, PA-C  clindamycin (CLEOCIN) 150 MG capsule Take 2 capsules (300 mg total) by mouth 3 (three) times daily. 09/27/15   Ashley Murrain, NP  fluticasone (FLONASE) 50 MCG/ACT nasal spray Place 2 sprays into both nostrils daily. 11/12/19   Lorre Opdahl S, PA-C  hydrocortisone 2.5 % lotion Apply topically 2 (two) times daily. Apply to rash. Do not apply to face. 09/24/15   Antonietta Breach, PA-C  ibuprofen (ADVIL,MOTRIN) 400 MG tablet Take 1 tablet (400 mg total) by mouth every 6 (six) hours as needed. 11/06/15   Varney Biles, MD  ondansetron (ZOFRAN ODT) 8 MG disintegrating tablet Take 1 tablet (8 mg total) by mouth every 8 (eight) hours as needed for nausea. 11/06/15   Varney Biles, MD  oseltamivir (TAMIFLU) 75 MG capsule Take 1 capsule (75 mg total) by mouth every 12 (twelve) hours. 11/06/15   Varney Biles, MD  SYNTHROID 75 MCG tablet Take 1 tablet (75 mcg total) by mouth daily before breakfast. Take one 75 mcg tablet of brand Synthroid daily. 11/10/14 11/11/15  Sherrlyn Hock, MD  traMADol (ULTRAM) 50 MG tablet Take 1 tablet (50 mg total) by mouth every 6 (six) hours as needed for moderate pain. 06/19/15  [name], MD    Allergies    Bee venom  Review of Systems   Review of Systems  Constitutional: Negative for chills and fever.  HENT: Positive for congestion, postnasal drip, rhinorrhea and sore throat. Negative for ear pain.   Eyes: Negative for visual disturbance.  Respiratory: Positive for cough. Negative for shortness of breath.   Cardiovascular: Negative for chest pain.  Gastrointestinal: Positive for vomiting. Negative for abdominal pain, constipation, diarrhea and nausea.  Genitourinary: Negative for dysuria.  Musculoskeletal: Negative for myalgias.  Skin: Negative for rash.  Neurological: Negative for headaches.  All other systems  reviewed and are negative.   Physical Exam Updated Vital Signs BP 121/78 (BP Location: Right Arm)   Pulse 82   Temp (!) 97.3 F (36.3 C) (Oral)   Resp 20   SpO2 100%   Physical Exam Constitutional:      General: He is not in acute distress.    Appearance: He is well-developed. He is not ill-appearing or toxic-appearing.  HENT:     Head: Normocephalic and atraumatic.     Mouth/Throat:     Mouth: Mucous membranes are moist.     Pharynx: Uvula midline. No pharyngeal swelling, oropharyngeal exudate, posterior oropharyngeal erythema or uvula swelling.     Tonsils: No tonsillar exudate or tonsillar abscesses. 1+ on the right. 1+ on the left.  Eyes:     Conjunctiva/sclera: Conjunctivae normal.  Cardiovascular:     Rate and Rhythm: Normal rate and regular rhythm.     Heart sounds: Normal heart sounds.  Pulmonary:     Effort: Pulmonary effort is normal.     Breath sounds: Normal breath sounds. No wheezing or rales.  Musculoskeletal:     Cervical back: Normal range of motion and neck supple.  Lymphadenopathy:     Cervical: No cervical adenopathy.  Skin:    General: Skin is warm and dry.  Neurological:     Mental Status: He is alert and oriented to person, place, and time.     ED Results / Procedures / Treatments   Labs (all labs ordered are listed, but only abnormal results are displayed) Labs Reviewed  NOVEL CORONAVIRUS, NAA (HOSP ORDER, SEND-OUT TO REF LAB; TAT 18-24 HRS)    EKG None  Radiology No results found.  Procedures Procedures (including critical care time)  Medications Ordered in ED Medications - No data to display  ED Course  I have reviewed the triage vital signs and the nursing notes.  Pertinent labs & imaging results that were available during my care of the patient were reviewed by me and considered in my medical decision making (see chart for details).    MDM Rules/Calculators/A&P                      Patient presenting for evaluation for  Covid.  Reports symptoms ongoing for 1 days.  Patient nontoxic, well-appearing, no distress.  Vital signs are reassuring. Tested for Covid in the ED. Results pending at the time of discharge. Advised on quarantine measures. Will give Rx for symptomatic management. Advised on f/u and return precautions. Pt voiced understanding of the plan and reasons to return. All questions answered, pt stable for d/c.   ---  Blake Pope was evaluated in Emergency Department on 11/12/2019 for the symptoms described in the history of present illness. He was evaluated in the context of the global COVID-19 pandemic, which necessitated consideration that the patient might be at risk for infection  with the SARS-CoV-2 virus that causes COVID-19. Institutional protocols and algorithms that pertain to the evaluation of patients at risk for COVID-19 are in a state of rapid change based on information released by regulatory bodies including the CDC and federal and state organizations. These policies and algorithms were followed during the patient's care in the ED.   Final Clinical Impression(s) / ED Diagnoses Final diagnoses:  Upper respiratory tract infection, unspecified type  Person under investigation for COVID-19    Rx / DC Orders ED Discharge Orders         Ordered    fluticasone (FLONASE) 50 MCG/ACT nasal spray  Daily     11/12/19 0936    benzonatate (TESSALON) 100 MG capsule  Every 8 hours     11/12/19 0936           Karrie Meres, PA-C 11/12/19 0936    Virgina Norfolk, DO 11/12/19 1611

## 2019-11-12 NOTE — ED Notes (Signed)
Pt given dc instructions pt verbalizes understanding.  

## 2019-11-12 NOTE — ED Triage Notes (Signed)
Pt reports sore throat, runny nose, cough since last night. Denies any recent sick contacts. Denies n/v/d, fevers or chills.

## 2019-11-12 NOTE — Discharge Instructions (Addendum)
You were given nasal spray for your nasal congestion. You were also given cough medication. Take as directed. Rotate tylenol and motrin to treat your fevers and body aches. Stay well hydrated.   Today you were were tested for the coronavirus.  The results will be available in the next 2-3 days.  If the results are positive the hospital will contact you.  If they are negative the hospital would not contact you.  You will need to self quarantine until you are aware of your results.  If they are positive you will need to self quarantine as directed below.  You should be isolated for at least 7 days since the onset of your symptoms AND >72 hours after symptoms resolution (absence of fever without the use of fever reducing medication and improvement in respiratory symptoms), whichever is longer  Please follow up with your primary care provider within 5-7 days for re-evaluation of your symptoms. If you do not have a primary care provider, information for a healthcare clinic has been provided for you to make arrangements for follow up care. Please return to the emergency department for any new or worsening symptoms.

## 2019-11-13 ENCOUNTER — Telehealth (HOSPITAL_COMMUNITY): Payer: Self-pay

## 2019-11-13 LAB — NOVEL CORONAVIRUS, NAA (HOSP ORDER, SEND-OUT TO REF LAB; TAT 18-24 HRS): SARS-CoV-2, NAA: NOT DETECTED

## 2020-07-09 ENCOUNTER — Other Ambulatory Visit: Payer: Self-pay

## 2020-07-09 ENCOUNTER — Encounter (HOSPITAL_COMMUNITY): Payer: Self-pay | Admitting: *Deleted

## 2020-07-09 ENCOUNTER — Emergency Department (HOSPITAL_COMMUNITY)
Admission: EM | Admit: 2020-07-09 | Discharge: 2020-07-09 | Disposition: A | Discharge status: Home / Self Care | Payer: HRSA Program | Attending: Emergency Medicine | Admitting: Emergency Medicine

## 2020-07-09 DIAGNOSIS — F17210 Nicotine dependence, cigarettes, uncomplicated: Secondary | ICD-10-CM | POA: Diagnosis not present

## 2020-07-09 DIAGNOSIS — Z79890 Hormone replacement therapy (postmenopausal): Secondary | ICD-10-CM | POA: Insufficient documentation

## 2020-07-09 DIAGNOSIS — U071 COVID-19: Secondary | ICD-10-CM | POA: Insufficient documentation

## 2020-07-09 DIAGNOSIS — E063 Autoimmune thyroiditis: Secondary | ICD-10-CM | POA: Diagnosis not present

## 2020-07-09 DIAGNOSIS — R519 Headache, unspecified: Secondary | ICD-10-CM | POA: Diagnosis present

## 2020-07-09 LAB — SARS CORONAVIRUS 2 BY RT PCR (HOSPITAL ORDER, PERFORMED IN ~~LOC~~ HOSPITAL LAB): SARS Coronavirus 2: POSITIVE — AB

## 2020-07-09 LAB — GROUP A STREP BY PCR: Group A Strep by PCR: NOT DETECTED

## 2020-07-09 NOTE — ED Triage Notes (Signed)
The pt has been around a person that has tested pos for covid  He has had a headache   A sorethroat general body aches no temp   No nausea vomiting or diarrhea

## 2020-07-09 NOTE — Discharge Instructions (Signed)
Covid test today was positive.  Please quarantine for at least 7 days from today.  If at the 10-day mark from symptom onset your symptoms are improving and you have not had to take any ibuprofen or Tylenol for fever for the last 24 hours then you can stop quarantining.  You can take Tylenol every 6 hours as needed for aches pains or fevers. Drink plenty of fluids and get rest.  Use warm water salt gargles, warm teas, honey, throat lozenges or over-the-counter numbing sprays for sore throat.  You can use Mucinex for cough and congestion.  You can follow-up at the post Covid care center at [facility] for reevaluation of your symptoms.  Please call to schedule an appointment.  They will see you in person.  Return to the emergency department if any concerning signs or symptoms develop such as shortness of breath, chest pains, loss of consciousness, or persistently elevated temperature.

## 2020-07-09 NOTE — ED Notes (Signed)
Patient verbalizes understanding of discharge instructions. Opportunity for questioning and answers were provided. Armband removed by staff, pt discharged from ED ambulatory.   

## 2020-07-09 NOTE — ED Triage Notes (Addendum)
Emergency Medicine Provider Triage Evaluation Note  Blake Pope , a 24 y.o. male  was evaluated in triage.  Pt complains of headaches, myalgias, nasal congestion, cough, sore throat for 2 to 3 days.  Symptoms started after exposure to Covid positive individual.  No fever, nausea, vomiting, diarrhea.  No chest pain or shortness of breath.  He is a current smoker of around a pack of cigarettes daily.  He is not vaccinated against Covid.  Review of Systems  Positive: Cough, nasal congestion, sore throat, headaches, myalgias Negative: Fevers, chest pain, shortness of breath, nausea, vomiting, diarrhea  Physical Exam  BP 119/69   Pulse 78   Temp 98.3 F (36.8 C) (Oral)   Resp 16   Ht 5\' 5"  (1.651 m)   Wt 59 kg   SpO2 98%   BMI 21.63 kg/m  Gen:   Awake, no distress   HEENT:  Atraumatic.  2+ tonsillar hypertrophy with erythema but no exudates or uvular deviation.  Tolerating secretions without difficulty. Resp:  Normal effort, speaking in full sentences without difficulty, lungs clear to auscultation bilaterally Cardiac:  Normal rate  Abd:   Nondistended, nontender  MSK:   Moves extremities without difficulty  Neuro:  Speech clear   Medical Decision Making  Medically screening exam initiated at 4:04 PM.  Appropriate orders placed.  Blake Pope was informed that the remainder of the evaluation will be completed by another provider, this initial triage assessment does not replace that evaluation, and the importance of remaining in the ED until their evaluation is complete.  Clinical Impression  Blake Pope was evaluated in Emergency Department on 07/09/2020 for the symptoms described in the history of present illness. He was evaluated in the context of the global COVID-19 pandemic, which necessitated consideration that the patient might be at risk for infection with the SARS-CoV-2 virus that causes COVID-19. Institutional protocols and algorithms that pertain to the evaluation of  patients at risk for COVID-19 are in a state of rapid change based on information released by regulatory bodies including the CDC and federal and state organizations. These policies and algorithms were followed during the patient's care in the ED.  Suspected Covid.  Lungs clear to auscultation.  Patient appears well-hydrated.  Tonsillar hypertrophy noted but no evidence of retropharyngeal abscess or deep space neck infection.  Will obtain Covid testing and strep test for further evaluation.   09/08/2020, PA-C 07/09/20 1605  ADDENDUM: Patient Covid test positive.  Strep test negative.  Vital signs remain reassuring and he is resting comfortably on reassessment.  Discussed quarantining at home per current CDC guidelines and symptomatic management.  He was given the information for the post Covid care center at [facility] for follow-up.  Encouraged him to call to schedule an appointment.  Discuss strict ED return precautions.  Patient verbalized understanding of and agreement with plan and is stable for discharge at this time.    [facility], PA-C 07/09/20 1821

## 2020-07-27 NOTE — ED Provider Notes (Signed)
[facility] EMERGENCY DEPARTMENT Provider Note   CSN: [id number] Arrival date & time: 07/09/20  1500     History Chief Complaint  Patient presents with  . Generalized Body Aches    Blake Pope is a 24 y.o. male with history of ADHD, thyroid disorder presents for evaluation of acute onset, persistent headaches, myalgias, nasal congestion, cough, sore throat for 2 to 3 days.  Symptoms started after exposure to Covid positive individual.  He denies fever, nausea, vomiting, diarrhea, chest pain or shortness of breath.  He is a current smoker.  He is not vaccinated against Covid.  No aggravating or alleviating factors noted.  He expresses concern about Covid diagnosis as he has a young child at home.  The history is provided by the patient.       Past Medical History:  Diagnosis Date  . ADHD (attention deficit hyperactivity disorder)   . Behavioral problems   . Goiter   . Hypothyroidism, acquired, autoimmune   . Physical growth delay   . Poor appetite   . Thyroid disease   . Thyroiditis, autoimmune   . Trichotillomania     Patient Active Problem List   Diagnosis Date Noted  . Weight loss 10/28/2012  . Physical growth delay   . Goiter   . Hypothyroidism, acquired, autoimmune   . Thyroiditis, autoimmune   . Trichotillomania   . ADHD (attention deficit hyperactivity disorder)   . Behavioral problems   . 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 lacrimal duct surgery         Family History  Problem Relation Age of Onset  . Cancer Mother   . Hypertension Mother   . Diabetes Paternal Grandmother     Social History   Tobacco Use  . Smoking status: Current Every Day Smoker    Packs/day: 1.00    Types: Cigarettes  . Smokeless tobacco: Never Used  Substance Use Topics  . Alcohol use: Yes  . Drug use: No    Home Medications Prior to Admission  medications   Medication Sig Start Date End Date Taking? Authorizing Provider  clindamycin (CLEOCIN) 150 MG capsule Take 2 capsules (300 mg total) by mouth 3 (three) times daily. 09/27/15   Janne Napoleon, NP  fluticasone (FLONASE) 50 MCG/ACT nasal spray Place 2 sprays into both nostrils daily. 11/12/19   Couture, Cortni S, PA-C  hydrocortisone 2.5 % lotion Apply topically 2 (two) times daily. Apply to rash. Do not apply to face. 09/24/15   Antony Madura, PA-C  ibuprofen (ADVIL,MOTRIN) 400 MG tablet Take 1 tablet (400 mg total) by mouth every 6 (six) hours as needed. 11/06/15   Derwood Kaplan, MD  ondansetron (ZOFRAN ODT) 8 MG disintegrating tablet Take 1 tablet (8 mg total) by mouth every 8 (eight) hours as needed for nausea. 11/06/15   Derwood Kaplan, MD  oseltamivir (TAMIFLU) 75 MG capsule Take 1 capsule (75 mg total) by mouth every 12 (twelve) hours. 11/06/15   Derwood Kaplan, MD  SYNTHROID 75 MCG tablet Take 1 tablet (75 mcg total) by mouth daily before breakfast. Take one 75 mcg tablet of brand Synthroid daily. 11/10/14 11/11/15  David Stall, MD  traMADol (ULTRAM) 50 MG tablet Take 1 tablet (50 mg total) by mouth every 6 (six) hours as needed for moderate pain. 06/19/15   [name], MD    Allergies    Bee venom  Review  of Systems   Review of Systems  Constitutional: Negative for fever.  HENT: Positive for congestion and sore throat.   Respiratory: Negative for shortness of breath.   Cardiovascular: Negative for chest pain.  Musculoskeletal: Positive for myalgias.  All other systems reviewed and are negative.   Physical Exam Updated Vital Signs BP 128/90   Pulse 77   Temp 98.4 F (36.9 C)   Resp 16   Ht 5\' 5"  (1.651 m)   Wt 59 kg   SpO2 99%   BMI 21.63 kg/m   Physical Exam Vitals and nursing note reviewed.  Constitutional:      General: He is not in acute distress.    Appearance: He is well-developed.     Comments: Resting comfortably in chair  HENT:     Head:  Normocephalic and atraumatic.     Nose: Congestion present.     Mouth/Throat:     Mouth: Mucous membranes are moist.     Pharynx: Posterior oropharyngeal erythema present.     Comments: 2+ tonsillar hypertrophy with no exudates or uvular deviation.  No trismus or sublingual abnormalities.  Tolerating secretions without difficulty. Eyes:     General:        Right eye: No discharge.        Left eye: No discharge.     Conjunctiva/sclera: Conjunctivae normal.  Neck:     Vascular: No JVD.     Trachea: No tracheal deviation.  Cardiovascular:     Rate and Rhythm: Normal rate.  Pulmonary:     Effort: Pulmonary effort is normal.     Breath sounds: Normal breath sounds.     Comments: Speaking in full sentences without difficulty. Abdominal:     General: There is no distension.     Palpations: Abdomen is soft.     Tenderness: There is no abdominal tenderness. There is no guarding.  Musculoskeletal:     Cervical back: Normal range of motion and neck supple. No rigidity.  Skin:    General: Skin is warm.     Findings: No erythema.  Neurological:     Mental Status: He is alert.  Psychiatric:        Behavior: Behavior normal.     ED Results / Procedures / Treatments   Labs (all labs ordered are listed, but only abnormal results are displayed) Labs Reviewed  SARS CORONAVIRUS 2 BY RT PCR (HOSPITAL ORDER, PERFORMED IN [facility]) - Abnormal; Notable for the following components:      Result Value   SARS Coronavirus 2 POSITIVE (*)    All other components within normal limits  GROUP A STREP BY PCR    EKG None  Radiology No results found.  Procedures Procedures (including critical care time)  Medications Ordered in ED Medications - No data to display  ED Course  I have reviewed the triage vital signs and the nursing notes.  Pertinent labs & imaging results that were available during my care of the patient were reviewed by me and considered in my medical decision  making (see chart for details).    MDM Rules/Calculators/A&P                          Blake Pope was evaluated in Emergency Department on 07/27/2020 for the symptoms described in the history of present illness. He was evaluated in the context of the global COVID-19 pandemic, which necessitated consideration that the patient might be  at risk for infection with the SARS-CoV-2 virus that causes COVID-19. Institutional protocols and algorithms that pertain to the evaluation of patients at risk for COVID-19 are in a state of rapid change based on information released by regulatory bodies including the CDC and federal and state organizations. These policies and algorithms were followed during the patient's care in the ED.  Patient presenting with Covid-like symptoms after Covid exposure.  He is afebrile, vital signs are stable.  He is nontoxic in appearance.  He exhibits no signs of respiratory distress and lungs are clear to auscultation bilaterally.  At this time I have a low suspicion of pneumonia.  His Covid test was positive.  With regards to his sore throat he has no trismus or evidence of airway compromise, Ludwig's angina, deep space neck infection or peritonsillar abscess.  Discussed symptomatic management and quarantining per current CDC guidelines.  He was given the information for the post Covid clinic at [facility] for follow-up.  Discussed strict ED return precautions.  Patient verbalized understanding of and agreement with plan and is stable for discharge at this time. Final Clinical Impression(s) / ED Diagnoses Final diagnoses:  COVID-19    Rx / DC Orders ED Discharge Orders    None       Jeanie Sewer, PA-C 07/27/20 0856    Milagros Loll, MD 07/28/20 [phone]

## 2021-10-10 ENCOUNTER — Encounter (HOSPITAL_COMMUNITY): Payer: Self-pay

## 2021-10-10 ENCOUNTER — Other Ambulatory Visit: Payer: Self-pay

## 2021-10-10 ENCOUNTER — Emergency Department (HOSPITAL_COMMUNITY)
Admission: EM | Admit: 2021-10-10 | Discharge: 2021-10-10 | Disposition: A | Discharge status: Home / Self Care | Payer: Medicaid Other | Attending: Emergency Medicine | Admitting: Emergency Medicine

## 2021-10-10 DIAGNOSIS — F17210 Nicotine dependence, cigarettes, uncomplicated: Secondary | ICD-10-CM | POA: Insufficient documentation

## 2021-10-10 DIAGNOSIS — E039 Hypothyroidism, unspecified: Secondary | ICD-10-CM | POA: Insufficient documentation

## 2021-10-10 DIAGNOSIS — J020 Streptococcal pharyngitis: Secondary | ICD-10-CM | POA: Insufficient documentation

## 2021-10-10 DIAGNOSIS — Z20822 Contact with and (suspected) exposure to covid-19: Secondary | ICD-10-CM | POA: Insufficient documentation

## 2021-10-10 DIAGNOSIS — Z79899 Other long term (current) drug therapy: Secondary | ICD-10-CM | POA: Insufficient documentation

## 2021-10-10 LAB — RESP PANEL BY RT-PCR (FLU A&B, COVID) ARPGX2
Influenza A by PCR: NEGATIVE
Influenza B by PCR: NEGATIVE
SARS Coronavirus 2 by RT PCR: NEGATIVE

## 2021-10-10 LAB — GROUP A STREP BY PCR: Group A Strep by PCR: NOT DETECTED

## 2021-10-10 MED ORDER — PENICILLIN V POTASSIUM 500 MG PO TABS: 500.0000 mg | ORAL_TABLET | Freq: Three times a day (TID) | ORAL | 0 refills | Status: DC

## 2021-10-10 NOTE — Discharge Instructions (Addendum)
You were seen in the emergency department for sore throat and body aches.  On exam, you do have exudates that we can see.  Your strep test was negative, however given your exam we would still like to treat you with penicillin for strep throat infection.  Please take this penicillin 3 times a day for the next 10 days.  Please take the antibiotics for a total of 10 days even if you start to feel better after few days.  If any symptoms change or worsen acutely, please return to the nearest emergency department.

## 2021-10-10 NOTE — ED Provider Notes (Signed)
Emergency Medicine Provider Triage Evaluation Note  Blake Pope , a 25 y.o. male  was evaluated in triage.  Pt complains of sore throat, general myalgias, fever, nausea, and cough. Has been ongoing for the last 1 to 2 days.  Review of Systems  Positive:  Negative: See above   Physical Exam  BP 117/74 (BP Location: Right Arm)   Pulse (!) 114   Temp 99.1 F (37.3 C) (Oral)   Resp 16   Ht 5\' 5"  (1.651 m)   Wt 61.2 kg   SpO2 99%   BMI 22.47 kg/m  Gen:   Awake, no distress   Resp:  Normal effort, lungs clear to auscultation bilaterally. MSK:   Moves extremities without difficulty  Other:  Tonsillar hypertrophy with exudate bilaterally.  Medical Decision Making  Medically screening exam initiated at 2:28 PM.  Appropriate orders placed.  Blake Pope was informed that the remainder of the evaluation will be completed by another provider, this initial triage assessment does not replace that evaluation, and the importance of remaining in the ED until their evaluation is complete.     Stevan Born Farwell, PA-C 10/10/21 1430    14/12/22, MD 10/10/21 [phone]

## 2021-10-10 NOTE — ED Triage Notes (Signed)
Pt c/o URI symptoms including body pain, fever, fatigue, nausea without vomiting, cough, sore throat all since yesterday. Pt tx at home with ibuprofen, last dose approximately one hour prior to arrival.

## 2021-10-10 NOTE — ED Notes (Signed)
Pt verbalizes understanding of d/c instructions. Prescriptions reviewed with patient. Pt ambulatory at d/c with all belongings.  

## 2021-10-10 NOTE — ED Notes (Signed)
PT called 3rd time for room, no response, pt not present at this time.

## 2021-10-10 NOTE — ED Provider Notes (Signed)
[facility] EMERGENCY DEPARTMENT Provider Note   CSN: [id number] Arrival date & time: 10/10/21  1245    History Chief Complaint  Patient presents with   URI   Sore Throat    Blake Pope is a 25 y.o. male who presents to the ED today with sore throat, myalgias, fever.  He first noticed this yesterday morning when his leg started aching.  This significantly worsened, and he noticed he had a sore throat last night.  He also endorses a dry cough, chills, and fever.  He was around a 2-year-old who had croup last Saturday, however no other sick contacts.  Denies chest pain.  Endorses some shortness of breath, however this is improved.  Has tried ibuprofen which is helped the fever but not his sore throat.  Denies vomiting.  No other complaints or concerns today.   URI Presenting symptoms: fever and sore throat   Presenting symptoms: no ear pain   Associated symptoms: myalgias   Sore Throat Associated symptoms include shortness of breath. Pertinent negatives include no chest pain.     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 Authorizing Provider  penicillin v potassium (VEETID) 500 MG tablet Take 1 tablet (500 mg total) by mouth 3 (three) times daily. 10/10/21  Yes Andrey Campanile, MD  fluticasone (FLONASE) 50 MCG/ACT nasal spray Place 2 sprays into both nostrils daily. 11/12/19   Couture, Cortni S, PA-C  hydrocortisone 2.5 % lotion Apply topically 2 (two) times daily. Apply to rash. Do not apply to face. 09/24/15   Antony Madura, PA-C  ibuprofen (ADVIL,MOTRIN) 400 MG tablet Take 1 tablet (400 mg total) by mouth every 6 (six) hours as needed. 11/06/15   Derwood Kaplan, MD  ondansetron (ZOFRAN ODT) 8 MG disintegrating tablet Take 1 tablet (8 mg total) by mouth every 8 (eight) hours as needed for nausea. 11/06/15   Derwood Kaplan, MD  SYNTHROID 75 MCG tablet Take 1 tablet (75 mcg total) by mouth daily before breakfast. Take one 75 mcg tablet of brand Synthroid daily. 11/10/14 11/11/15  David Stall, MD  traMADol (ULTRAM) 50 MG tablet Take 1 tablet (50 mg total) by mouth every 6 (six) hours  as needed for moderate pain. 06/19/15   [name], MD    Allergies    Bee venom  Review of Systems   Review of Systems  Constitutional:  Positive for chills and fever.  HENT:  Positive for sore throat. Negative for ear pain.   Eyes: Negative.   Respiratory:  Positive for shortness of breath.   Cardiovascular:  Negative for chest pain.  Gastrointestinal:  Positive for nausea. Negative for vomiting.  Endocrine: Negative.   Genitourinary: Negative.   Musculoskeletal:  Positive for myalgias.  Skin: Negative.   Allergic/Immunologic: Negative.   Neurological: Negative.   Hematological: Negative.   Psychiatric/Behavioral: Negative.     Physical Exam Updated Vital Signs BP 106/72  (BP Location: Left Arm)   Pulse 87   Temp 98.9 F (37.2 C) (Oral)   Resp 16   Ht 5\' 5"  (1.651 m)   Wt 61.2 kg   SpO2 97%   BMI 22.47 kg/m   Physical Exam Constitutional:      General: He is not in acute distress.    Appearance: Normal appearance.  HENT:     Head: Normocephalic and atraumatic.     Nose: Nose normal.     Mouth/Throat:     Pharynx: Oropharyngeal exudate present.  Eyes:     Extraocular Movements: Extraocular movements intact.     Pupils: Pupils are equal, round, and reactive to light.  Cardiovascular:     Rate and Rhythm: Normal rate and regular rhythm.     Heart sounds: No murmur heard.   No friction rub. No gallop.  Pulmonary:     Effort: Pulmonary effort is normal.     Breath sounds: Normal breath sounds. No wheezing, rhonchi or rales.  Abdominal:     General: Abdomen is flat. There is no distension.  Musculoskeletal:        General: Normal range of motion.  Skin:    General: Skin is warm and dry.  Neurological:     General: No focal deficit present.     Mental Status: He is alert and oriented to person, place, and time. Mental status is at baseline.  Psychiatric:        Mood and Affect: Mood normal.        Behavior: Behavior normal.    ED Results / Procedures / Treatments   Labs (all labs ordered are listed, but only abnormal results are displayed) Labs Reviewed  RESP PANEL BY RT-PCR (FLU A&B, COVID) ARPGX2  GROUP A STREP BY PCR    EKG EKG Interpretation  Date/Time:  Monday October 10 2021 13:45:45 EST Ventricular Rate:  115 PR Interval:  112 QRS Duration: 94 QT Interval:  310 QTC Calculation: 428 R Axis:   83 Text Interpretation: Sinus tachycardia T wave abnormality, consider inferior ischemia T wave abnormality, consider anterolateral ischemia Abnormal ECG No prior ECG for comparison, ?12-12-2001. No STEMI Confirmed by [id number] (Theda Belfast) on 10/10/2021 7:22:25 PM  Radiology No results found.  Procedures Procedures   Medications  Ordered in ED Medications - No data to display  ED Course  I have reviewed the triage vital signs and the nursing notes.  Pertinent labs & imaging results that were available during my care of the patient were reviewed by me and considered in my medical decision making (see chart for details).    MDM Rules/Calculators/A&P  This is a 25 year old who presents to the ED today with sore throat, myalgias, dry cough, and fever/chills.  At this time, the patient is afebrile and hemodynamically stable.  On exam, he does have tonsillar hypertrophy and exudates.  Strep test negative.  Respiratory panel negative.  Differential includes viral URI versus mononucleosis versus strep pharyngitis.  Though his strep test was negative, his exam is concerning for strep throat.  Therefore, we will treat for strep pharyngitis with p.o. penicillin for the next 10 days.  Patient was instructed to take full course, he was discharged home in stable condition.  He was advised to return to his nearest ED with any worsening or change in his symptoms.   Final Clinical Impression(s) / ED Diagnoses Final diagnoses:  Strep pharyngitis    Rx / DC Orders ED Discharge Orders          Ordered    penicillin v potassium (VEETID) 500 MG tablet  3 times daily        10/10/21 2116             Andrey Campanile, MD 10/10/21 2130    Tegeler, Canary Brim, MD 10/11/21 [phone]

## 2021-10-10 NOTE — ED Notes (Signed)
PT called 2x for room. Will attempt again later.

## 2022-02-06 ENCOUNTER — Other Ambulatory Visit: Payer: Self-pay

## 2022-02-06 ENCOUNTER — Emergency Department (HOSPITAL_COMMUNITY)
Admission: EM | Admit: 2022-02-06 | Discharge: 2022-02-06 | Disposition: A | Discharge status: Home / Self Care | Payer: Medicaid Other | Attending: Emergency Medicine | Admitting: Emergency Medicine

## 2022-02-06 ENCOUNTER — Encounter (HOSPITAL_COMMUNITY): Payer: Self-pay | Admitting: Emergency Medicine

## 2022-02-06 DIAGNOSIS — Z20822 Contact with and (suspected) exposure to covid-19: Secondary | ICD-10-CM | POA: Insufficient documentation

## 2022-02-06 DIAGNOSIS — E063 Autoimmune thyroiditis: Secondary | ICD-10-CM | POA: Insufficient documentation

## 2022-02-06 DIAGNOSIS — J069 Acute upper respiratory infection, unspecified: Secondary | ICD-10-CM | POA: Insufficient documentation

## 2022-02-06 DIAGNOSIS — Z79899 Other long term (current) drug therapy: Secondary | ICD-10-CM | POA: Insufficient documentation

## 2022-02-06 LAB — GROUP A STREP BY PCR: Group A Strep by PCR: NOT DETECTED

## 2022-02-06 LAB — RESP PANEL BY RT-PCR (FLU A&B, COVID) ARPGX2
Influenza A by PCR: NEGATIVE
Influenza B by PCR: NEGATIVE
SARS Coronavirus 2 by RT PCR: NEGATIVE

## 2022-02-06 MED ORDER — BENZONATATE 100 MG PO CAPS
200.0000 mg | ORAL_CAPSULE | Freq: Once | ORAL | Status: AC
  Administered 2022-02-06: 200 mg via ORAL
  Filled 2022-02-06: qty 2

## 2022-02-06 MED ORDER — BENZONATATE 100 MG PO CAPS: 100.0000 mg | ORAL_CAPSULE | Freq: Three times a day (TID) | ORAL | 0 refills | Status: DC

## 2022-02-06 NOTE — ED Provider Notes (Signed)
?[facility] EMERGENCY DEPARTMENT ?Provider Note ? ? ?CSN: [id number] ?Arrival date & time: 02/06/22  1241 ? ?  ? ?History ? ?Chief Complaint  ?Patient presents with  ? Cough  ? Nasal Congestion  ? ? ?Blake Pope is a 26 y.o. male. ? ?HPI ? ?  ?26 year old male comes in with chief complaint of cough and nasal congestion. ? ?Patient has been sick for the last 4 days.  He is having headaches, cough, sore throat, congestion.  He thought that the symptoms are because of pollen.  He denies any fevers, chills and indicates that the cough is mostly dry.  Patient has no underlying lung disease or immunosuppression.  Cough is the biggest hassle for him followed by sore throat.  He is able to eat and drink well. ? ? ?Home Medications ?Prior to Admission medications   ?Medication Sig Start Date End Date Taking? Authorizing Provider  ?benzonatate (TESSALON) 100 MG capsule Take 1 capsule (100 mg total) by mouth every 8 (eight) hours. 02/06/22  Yes Derwood Kaplan, MD  ?fluticasone (FLONASE) 50 MCG/ACT nasal spray Place 2 sprays into both nostrils daily. 11/12/19   Couture, Cortni S, PA-C  ?hydrocortisone 2.5 % lotion Apply topically 2 (two) times daily. Apply to rash. Do not apply to face. 09/24/15   Antony Madura, PA-C  ?ibuprofen (ADVIL,MOTRIN) 400 MG tablet Take 1 tablet (400 mg total) by mouth every 6 (six) hours as needed. 11/06/15   Derwood Kaplan, MD  ?ondansetron (ZOFRAN ODT) 8 MG disintegrating tablet Take 1 tablet (8 mg total) by mouth every 8 (eight) hours as needed for nausea. 11/06/15   Derwood Kaplan, MD  ?penicillin v potassium (VEETID) 500 MG tablet Take 1 tablet (500 mg total) by mouth 3 (three) times daily. 10/10/21   Andrey Campanile, MD  ?SYNTHROID 75 MCG tablet Take 1 tablet (75 mcg total) by mouth daily before breakfast. Take one 75 mcg tablet of brand Synthroid daily. 11/10/14 11/11/15  David Stall, MD  ?traMADol (ULTRAM) 50 MG tablet Take 1 tablet (50 mg total) by mouth every 6 (six)  hours as needed for moderate pain. 06/19/15   [name], MD  ?   ? ?Allergies    ?Bee venom   ? ?Review of Systems   ?Review of Systems  ?All other systems reviewed and are negative. ? ?Physical Exam ?Updated Vital Signs ?BP 116/83   Pulse 99   Temp 98.3 ?F (36.8 ?C) (Oral)   Resp (!) 24   SpO2 100%  ?Physical Exam ?Vitals and nursing note reviewed.  ?Constitutional:   ?   Appearance: He is well-developed.  ?HENT:  ?   Head: Atraumatic.  ?   Nose: Congestion present.  ?   Mouth/Throat:  ?   Pharynx: Posterior oropharyngeal erythema present. No oropharyngeal exudate.  ?Eyes:  ?   Pupils: Pupils are equal, round, and reactive to light.  ?Cardiovascular:  ?   Rate and Rhythm: Normal rate.  ?Pulmonary:  ?   Effort: Pulmonary effort is normal.  ?   Breath sounds: No wheezing, rhonchi or rales.  ?Musculoskeletal:     ?   General: Normal range of motion.  ?   Cervical back: Neck supple.  ?Skin: ?   General: Skin is warm.  ?Neurological:  ?   Mental Status: He is alert and oriented to person, place, and time.  ? ? ?ED Results / Procedures / Treatments   ?Labs ?(all labs ordered are listed, but only abnormal  results are displayed) ?Labs Reviewed  ?RESP PANEL BY RT-PCR (FLU A&B, COVID) ARPGX2  ?GROUP A STREP BY PCR  ? ? ?EKG ?None ? ?Radiology ?No results found. ? ?Procedures ?Procedures  ? ? ?Medications Ordered in ED ?Medications  ?benzonatate (TESSALON) capsule 200 mg (has no administration in time range)  ? ? ?ED Course/ Medical Decision Making/ A&P ?  ?                        ?Medical Decision Making ?26 year old patient with no significant medical history comes in with chief complaint of cough, congestion, sore throat.  Symptoms have been present for 4 days now. ? ?His vital signs are stable and within normal limits.  He is noted to be resting comfortably when I saw him.  No hypoxia.  ? ?On exam lungs are clear.  He has some cervical lymphadenopathy without pharyngeal exudates. ? ?COVID-19, strep test, flu test  were ordered and are negative. ? ?Lungs are completely clear, therefore I considered x-ray, however in the setting of normal lung findings, no hypoxia no fevers -clinical suspicion for bacterial pneumonia is quite low.  X-rays not ordered at this time. ? ?Patient wants symptom relief, primarily with his cough.  With for the sore throat he will take over-the-counter Tylenol and ibuprofen.  Tessalon Perles prescribed. ? ?Risk ?Prescription drug management. ? ? ? ?Final Clinical Impression(s) / ED Diagnoses ?Final diagnoses:  ?Viral URI with cough  ? ? ?Rx / DC Orders ?ED Discharge Orders   ? ?      Ordered  ?  benzonatate (TESSALON) 100 MG capsule  Every 8 hours       ? 02/06/22 1531  ? ?  ?  ? ?  ? ? ?  ?Derwood Kaplan, MD ?02/06/22 1535 ? ?

## 2022-02-06 NOTE — Discharge Instructions (Addendum)
Your strep and covid and influenza test are negative. ? ?You likely have a different kind of virus that is causing upper respiratory infection. ? ?Take the medications that are prescribed for your cough.  As discussed, warm beverages, teaspoon of honey etc. also help.  Symptoms are self-limiting, and will slowly get better. ?

## 2022-02-06 NOTE — ED Provider Triage Note (Signed)
Emergency Medicine Provider Triage Evaluation Note ? ?Blake Pope , a 26 y.o. male  was evaluated in triage.  Pt complains of flu like symptoms for the past 4 days.  Patient complains of a headache, congestion, cough, sore throat.  He attributed his symptoms to his car being covered in the pollen earlier last week.  Denies any fevers or chills.  Denies any recent sick contacts. ? ?Review of Systems  ?Positive: + cough, congestion, headache, sore throat ?Negative: - body aches, fevers, chills, difficulty swallowing, drooling, voice change ? ?Physical Exam  ?BP 116/83   Pulse 99   Temp 98.3 ?F (36.8 ?C) (Oral)   Resp (!) 24   SpO2 100%  ?Gen:   Awake, no distress   ?Resp:  Normal effort  ?MSK:   Moves extremities without difficulty  ?Other:  Bilateral 1+ tonsillar hypertrophy with erythema. No exudate. Uvula midline.  ? ?Medical Decision Making  ?Medically screening exam initiated at 1:42 PM.  Appropriate orders placed.  Blake Pope was informed that the remainder of the evaluation will be completed by another provider, this initial triage assessment does not replace that evaluation, and the importance of remaining in the ED until their evaluation is complete. ? ? ?  ?Tanda Rockers, PA-C ?02/06/22 1343 ? ?

## 2022-02-06 NOTE — ED Triage Notes (Signed)
Patient coming from home, complaint of nasal congestion and cough since Friday, pt states he felt like it was allergies at first but it has not gotten better. VSS. NAD. ?

## 2024-05-29 ENCOUNTER — Ambulatory Visit (HOSPITAL_COMMUNITY)
Admission: EM | Admit: 2024-05-29 | Discharge: 2024-05-29 | Disposition: A | Discharge status: Home / Self Care | Attending: Emergency Medicine | Admitting: Emergency Medicine

## 2024-05-29 ENCOUNTER — Encounter (HOSPITAL_COMMUNITY): Payer: Self-pay

## 2024-05-29 DIAGNOSIS — R051 Acute cough: Secondary | ICD-10-CM | POA: Diagnosis not present

## 2024-05-29 DIAGNOSIS — F17200 Nicotine dependence, unspecified, uncomplicated: Secondary | ICD-10-CM

## 2024-05-29 DIAGNOSIS — J4520 Mild intermittent asthma, uncomplicated: Secondary | ICD-10-CM | POA: Diagnosis not present

## 2024-05-29 MED ORDER — ALBUTEROL SULFATE HFA 108 (90 BASE) MCG/ACT IN AERS
INHALATION_SPRAY | RESPIRATORY_TRACT | Status: AC
  Filled 2024-05-29: qty 6.7

## 2024-05-29 MED ORDER — IPRATROPIUM-ALBUTEROL 0.5-2.5 (3) MG/3ML IN SOLN
3.0000 mL | Freq: Once | RESPIRATORY_TRACT | Status: AC
  Administered 2024-05-29: 3 mL via RESPIRATORY_TRACT

## 2024-05-29 MED ORDER — ALBUTEROL SULFATE HFA 108 (90 BASE) MCG/ACT IN AERS
2.0000 | INHALATION_SPRAY | Freq: Once | RESPIRATORY_TRACT | Status: AC
  Administered 2024-05-29: 2 via RESPIRATORY_TRACT

## 2024-05-29 MED ORDER — IPRATROPIUM-ALBUTEROL 0.5-2.5 (3) MG/3ML IN SOLN
RESPIRATORY_TRACT | Status: AC
  Filled 2024-05-29: qty 3

## 2024-05-29 MED ORDER — PROMETHAZINE-DM 6.25-15 MG/5ML PO SYRP: 5.0000 mL | ORAL_SOLUTION | Freq: Four times a day (QID) | ORAL | 0 refills | Status: AC | PRN

## 2024-05-29 MED ORDER — PREDNISONE 20 MG PO TABS: 40.0000 mg | ORAL_TABLET | Freq: Every day | ORAL | 0 refills | Status: AC

## 2024-05-29 NOTE — Discharge Instructions (Signed)
 Please use the inhaler 3 times daily (every 6 hours) for the next several days. Then continue as needed.  Prednisone  40 mg daily for the next 5 days  The promethazine  DM cough syrup can be used up to 4 times daily. If this medication makes you drowsy, take only once before bed.  Try these interventions for the next 4-5 days. If no improvement, please return for re-evaluation

## 2024-05-29 NOTE — ED Notes (Addendum)
 Patient reports that he has been SOB and having a cough since waking this AM. Patient states he had nsal congestin yesterday. Patient talking in complete sentences. Patient states SOB with activity.   Patient states he has not taken any medications for his symptoms.

## 2024-05-29 NOTE — ED Provider Notes (Signed)
 [facility]    CSN: [id number] Arrival date & time: 05/29/24  1305     History   Chief Complaint Chief Complaint  Patient presents with   Cough   Shortness of Breath    HPI Blake Pope is a 28 y.o. male.  Woke this morning with cough and shortness of breath. Shob is with exertion.  Cough productive of massive amount of mucous. Maybe some nasal congestion yesterday but it resolved Denies fever  No medications taken yet  He does have history of acquired hypothyroidism  Current every day smoker since age 28  Past Medical History:  Diagnosis Date   ADHD (attention deficit hyperactivity disorder)    Behavioral problems    Goiter    Hypothyroidism, acquired, autoimmune    Physical growth delay    Poor appetite    Thyroid disease    Thyroiditis, autoimmune    Trichotillomania     Patient Active Problem List   Diagnosis Date Noted   Weight loss 10/28/2012   Physical growth delay    Goiter    Hypothyroidism, acquired, autoimmune    Thyroiditis, autoimmune    Trichotillomania    ADHD (attention deficit hyperactivity disorder)    Behavioral problems    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 Authorizing Provider  predniSONE  (DELTASONE ) 20 MG tablet Take 2 tablets (40 mg total) by mouth daily with breakfast for 5 days. 05/29/24 06/03/24 Yes Braelee Herrle, Asberry, PA-C  promethazine -dextromethorphan (PROMETHAZINE -DM) 6.25-15 MG/5ML syrup Take 5 mLs by mouth 4 (four) times daily as needed for cough. 05/29/24  Yes Aviyana Sonntag, Asberry, PA-C    Family History Family History  Problem Relation Age of Onset   Cancer Mother    Hypertension Mother    Diabetes Paternal Grandmother     Social History Social History   Tobacco Use   Smoking status:  Every Day    Current packs/day: 1.00    Types: Cigarettes   Smokeless tobacco: Never  Vaping Use   Vaping status: Never Used  Substance Use Topics   Alcohol use: Not Currently   Drug use: No     Allergies   Bee venom   Review of Systems Review of Systems As per HPI   Physical Exam Triage Vital Signs ED Triage Vitals  Encounter Vitals Group     BP 05/29/24 1317 109/78     Girls Systolic BP Percentile --      Girls Diastolic BP Percentile --      Boys Systolic BP Percentile --      Boys Diastolic BP Percentile --      Pulse Rate 05/29/24 1317 69     Resp 05/29/24 1317 16     Temp 05/29/24 1317 99 F (37.2 C)     Temp Source 05/29/24 1317 Oral     SpO2 05/29/24 1317 95 %     Weight --      Height --      Head Circumference --      Peak Flow --      Pain Score 05/29/24 1318 0     Pain Loc --      Pain Education --      Exclude from Growth Chart --  No data found.  Updated Vital Signs BP 109/78   Pulse 69   Temp 99 F (37.2 C) (Oral)   Resp 16   SpO2 95%   Visual Acuity Right Eye Distance:   Left Eye Distance:   Bilateral Distance:    Right Eye Near:   Left Eye Near:    Bilateral Near:     Physical Exam Vitals and nursing note reviewed.  Constitutional:      Appearance: He is not ill-appearing.     Comments: No respiratory distress, speaks in full sentences  HENT:     Right Ear: Tympanic membrane and ear canal normal.     Left Ear: Tympanic membrane and ear canal normal.     Nose: No congestion or rhinorrhea.     Mouth/Throat:     Mouth: Mucous membranes are moist.     Pharynx: Oropharynx is clear. No posterior oropharyngeal erythema.  Eyes:     Conjunctiva/sclera: Conjunctivae normal.  Cardiovascular:     Rate and Rhythm: Normal rate and regular rhythm.     Pulses: Normal pulses.     Heart sounds: Normal heart sounds.  Pulmonary:     Effort: Pulmonary effort is normal. No respiratory distress.     Breath sounds: Normal breath sounds.  No wheezing, rhonchi or rales.     Comments: Occasionally deep breath causes dry sounding cough.  Lung sounds clear throughout Musculoskeletal:     Cervical back: Normal range of motion.  Lymphadenopathy:     Cervical: No cervical adenopathy.  Skin:    General: Skin is warm and dry.  Neurological:     Mental Status: He is alert and oriented to person, place, and time.      UC Treatments / Results  Labs (all labs ordered are listed, but only abnormal results are displayed) Labs Reviewed - No data to display  EKG   Radiology No results found.  Procedures Procedures (including critical care time)  Medications Ordered in UC Medications  ipratropium-albuterol  (DUONEB) 0.5-2.5 (3) MG/3ML nebulizer solution 3 mL (3 mLs Nebulization Given 05/29/24 1414)  albuterol  (VENTOLIN  HFA) 108 (90 Base) MCG/ACT inhaler 2 puff (2 puffs Inhalation Given 05/29/24 1445)    Initial Impression / Assessment and Plan / UC Course  I have reviewed the triage vital signs and the nursing notes.  Pertinent labs & imaging results that were available during my care of the patient were reviewed by me and considered in my medical decision making (see chart for details).  Afebrile, well-appearing, clear lung sounds Dry, reactive cough with deep breath occasionally DuoNeb treatment is given, patient does report improvement and easier to take a deep breath.  I did offer him a chest x-ray given his 15 year smoking history.  He declines today.  Staff has shown him how to use the albuterol  inhaler, will continue to use 2-3 times daily for the next several days.  No wheezing on exam but with reactive cough will cover with prednisone  burst.  Promethazine  DM cough syrup is also prescribed discussed reasons to return to the clinic.  He understands if no improvement in the next 4 to 5 days, I would like him to return for x-ray.  Final Clinical Impressions(s) / UC Diagnoses   Final diagnoses:  Acute cough  Mild  intermittent reactive airway disease without complication     Discharge Instructions      Please use the inhaler 3 times daily (every 6 hours) for the next several days. Then continue as  needed.  Prednisone  40 mg daily for the next 5 days  The promethazine  DM cough syrup can be used up to 4 times daily. If this medication makes you drowsy, take only once before bed.  Try these interventions for the next 4-5 days. If no improvement, please return for re-evaluation      ED Prescriptions     Medication Sig Dispense Auth. Provider   predniSONE  (DELTASONE ) 20 MG tablet Take 2 tablets (40 mg total) by mouth daily with breakfast for 5 days. 10 tablet Lari Linson, PA-C   promethazine -dextromethorphan (PROMETHAZINE -DM) 6.25-15 MG/5ML syrup Take 5 mLs by mouth 4 (four) times daily as needed for cough. 240 mL Javis Abboud, Asberry, PA-C      PDMP not reviewed this encounter.   Jeryl Asberry, PA-C 05/29/24 1519
